# Patient Record
Sex: Male | Born: 1970
Health system: Southern US, Community
[De-identification: ages and names within clinical notes are randomized; demographics above are authoritative.]

## PROBLEM LIST (undated history)

## (undated) DIAGNOSIS — F17200 Nicotine dependence, unspecified, uncomplicated: Secondary | ICD-10-CM

## (undated) DIAGNOSIS — Z87442 Personal history of urinary calculi: Secondary | ICD-10-CM

## (undated) DIAGNOSIS — Z789 Other specified health status: Secondary | ICD-10-CM

## (undated) DIAGNOSIS — K649 Unspecified hemorrhoids: Secondary | ICD-10-CM

---

## 2008-03-26 ENCOUNTER — Emergency Department: Payer: Self-pay | Admitting: Unknown Physician Specialty

## 2008-03-28 ENCOUNTER — Emergency Department (HOSPITAL_COMMUNITY): Admission: EM | Admit: 2008-03-28 | Discharge: 2008-03-28 | Payer: Self-pay | Admitting: Emergency Medicine

## 2008-05-23 ENCOUNTER — Emergency Department: Payer: Self-pay | Admitting: Emergency Medicine

## 2008-06-10 ENCOUNTER — Emergency Department: Payer: Self-pay | Admitting: Emergency Medicine

## 2008-07-06 ENCOUNTER — Emergency Department (HOSPITAL_COMMUNITY): Admission: EM | Admit: 2008-07-06 | Discharge: 2008-07-06 | Payer: Self-pay | Admitting: Emergency Medicine

## 2008-07-07 ENCOUNTER — Emergency Department: Payer: Self-pay | Admitting: Emergency Medicine

## 2008-07-10 ENCOUNTER — Emergency Department (HOSPITAL_COMMUNITY): Admission: EM | Admit: 2008-07-10 | Discharge: 2008-07-10 | Payer: Self-pay | Admitting: Emergency Medicine

## 2008-07-13 ENCOUNTER — Emergency Department: Payer: Self-pay | Admitting: Emergency Medicine

## 2008-07-21 ENCOUNTER — Emergency Department (HOSPITAL_COMMUNITY): Admission: EM | Admit: 2008-07-21 | Discharge: 2008-07-21 | Payer: Self-pay | Admitting: Emergency Medicine

## 2008-08-15 ENCOUNTER — Emergency Department (HOSPITAL_COMMUNITY): Admission: EM | Admit: 2008-08-15 | Discharge: 2008-08-15 | Payer: Self-pay | Admitting: Emergency Medicine

## 2008-08-18 ENCOUNTER — Emergency Department: Payer: Self-pay | Admitting: Emergency Medicine

## 2009-03-22 ENCOUNTER — Emergency Department (HOSPITAL_COMMUNITY): Admission: EM | Admit: 2009-03-22 | Discharge: 2009-03-22 | Payer: Self-pay | Admitting: Emergency Medicine

## 2010-05-18 ENCOUNTER — Emergency Department: Payer: Self-pay | Admitting: Emergency Medicine

## 2011-10-30 ENCOUNTER — Emergency Department: Payer: Self-pay | Admitting: Emergency Medicine

## 2011-11-02 LAB — BETA STREP CULTURE(ARMC)

## 2013-06-07 ENCOUNTER — Emergency Department: Payer: Self-pay | Admitting: Emergency Medicine

## 2013-06-07 LAB — URINALYSIS, COMPLETE
Bacteria: NONE SEEN
Glucose,UR: NEGATIVE mg/dL (ref 0–75)
Ketone: NEGATIVE
Leukocyte Esterase: NEGATIVE
Protein: 30
RBC,UR: 121 /HPF (ref 0–5)
Specific Gravity: 1.03 (ref 1.003–1.030)
Squamous Epithelial: <1

## 2013-06-07 LAB — BASIC METABOLIC PANEL
Anion Gap: 7 (ref 7–16)
BUN: 16 mg/dL (ref 7–18)
Calcium, Total: 9.3 mg/dL (ref 8.5–10.1)
Osmolality: 280 (ref 275–301)
Potassium: 3.7 mmol/L (ref 3.5–5.1)
Sodium: 139 mmol/L (ref 136–145)

## 2013-06-07 LAB — CBC
MCHC: 34.2 g/dL (ref 32.0–36.0)
Platelet: 261 10*3/uL (ref 150–440)
RDW: 13.5 % (ref 11.5–14.5)

## 2013-06-24 ENCOUNTER — Emergency Department (HOSPITAL_COMMUNITY)
Admission: EM | Admit: 2013-06-24 | Discharge: 2013-06-24 | Disposition: A | Discharge status: Home / Self Care | Payer: Self-pay | Attending: Emergency Medicine | Admitting: Emergency Medicine

## 2013-06-24 ENCOUNTER — Encounter (HOSPITAL_COMMUNITY): Payer: Self-pay | Admitting: *Deleted

## 2013-06-24 ENCOUNTER — Emergency Department (HOSPITAL_COMMUNITY): Payer: Self-pay

## 2013-06-24 DIAGNOSIS — R05 Cough: Secondary | ICD-10-CM | POA: Insufficient documentation

## 2013-06-24 DIAGNOSIS — R059 Cough, unspecified: Secondary | ICD-10-CM | POA: Insufficient documentation

## 2013-06-24 DIAGNOSIS — F17200 Nicotine dependence, unspecified, uncomplicated: Secondary | ICD-10-CM | POA: Insufficient documentation

## 2013-06-24 NOTE — ED Notes (Signed)
Pt reports dry cough for over a month and reports body aches.

## 2013-06-24 NOTE — ED Notes (Signed)
Dr. Wickline at the bedside.  

## 2013-06-24 NOTE — ED Provider Notes (Signed)
CSN: [id number]     Arrival date & time 06/24/13  0935 History   First MD Initiated Contact with Patient 06/24/13 1009     Chief Complaint  Patient presents with  . Cough   Pt seen with medical student, I performed history/physical/documentation    Patient is a 42 y.o. male presenting with cough. The history is provided by the patient.  Cough Cough characteristics:  Dry Severity:  Moderate Onset quality:  Gradual Duration: 1 month. Timing:  Intermittent Progression:  Worsening Chronicity:  New Smoker: yes   Relieved by:  Nothing Associated symptoms: myalgias   Associated symptoms: no chest pain, no fever and no shortness of breath     PMH - none History reviewed. No pertinent past surgical history. No family history on file. History  Substance Use Topics  . Smoking status: Current Every Day Smoker  . Smokeless tobacco: Not on file  . Alcohol Use: Not on file     Comment: rare    Review of Systems  Constitutional: Negative for fever.  Respiratory: Positive for cough. Negative for shortness of breath.   Cardiovascular: Negative for chest pain.  Gastrointestinal: Negative for vomiting.  Musculoskeletal: Positive for myalgias.  All other systems reviewed and are negative.    Allergies  Review of patient's allergies indicates no known allergies.  Home Medications   Current Outpatient Rx  Name  Route  Sig  Dispense  Refill  . oxyCODONE-acetaminophen (PERCOCET/ROXICET) 5-325 MG per tablet   Oral   Take 1 tablet by mouth every 4 (four) hours as needed for pain.         . Tamsulosin HCl (FLOMAX PO)   Oral   Take 1 tablet by mouth daily as needed (to pass kidney stone).          BP 131/81  Pulse 63  Temp(Src) 98 F (36.7 C) (Oral)  Resp 18  SpO2 98% Physical Exam CONSTITUTIONAL: Well developed/well nourished HEAD: Normocephalic/atraumatic EYES: EOMI/PERRL ENMT: Mucous membranes moist, uvula midline, pharynx non-erythematous NECK: supple no meningeal  signs SPINE:entire spine nontender CV: S1/S2 noted, no murmurs/rubs/gallops noted LUNGS: Lungs are clear to auscultation bilaterally, no apparent distress ABDOMEN: soft, nontender, no rebound or guarding GU:no cva tenderness NEURO: Pt is awake/alert, moves all extremitiesx4 EXTREMITIES: pulses normal, full ROM, no LE edema SKIN: warm, color normal PSYCH: no abnormalities of mood noted  ED Course  Procedures  10:41 AM Pt with dry cough for one month.  Will get CXR.  He was informed to quit smoking He has not taken any meds or antibiotics as of yet  MDM  No diagnosis found. Nursing notes including past medical history and social history reviewed and considered in documentation xrays reviewed and considered     Joya Gaskins, MD 06/24/13 1124

## 2014-06-05 ENCOUNTER — Emergency Department: Payer: Self-pay | Admitting: Student

## 2014-06-14 DIAGNOSIS — K029 Dental caries, unspecified: Secondary | ICD-10-CM | POA: Insufficient documentation

## 2014-06-14 DIAGNOSIS — K089 Disorder of teeth and supporting structures, unspecified: Secondary | ICD-10-CM | POA: Insufficient documentation

## 2014-06-14 DIAGNOSIS — Z79899 Other long term (current) drug therapy: Secondary | ICD-10-CM | POA: Insufficient documentation

## 2014-06-14 DIAGNOSIS — F17200 Nicotine dependence, unspecified, uncomplicated: Secondary | ICD-10-CM | POA: Insufficient documentation

## 2014-06-15 ENCOUNTER — Encounter (HOSPITAL_COMMUNITY): Payer: Self-pay | Admitting: Emergency Medicine

## 2014-06-15 ENCOUNTER — Emergency Department (HOSPITAL_COMMUNITY)
Admission: EM | Admit: 2014-06-15 | Discharge: 2014-06-15 | Disposition: A | Discharge status: Home / Self Care | Payer: Self-pay | Attending: Emergency Medicine | Admitting: Emergency Medicine

## 2014-06-15 DIAGNOSIS — K029 Dental caries, unspecified: Secondary | ICD-10-CM

## 2014-06-15 MED ORDER — BUPIVACAINE-EPINEPHRINE (PF) 0.5% -1:200000 IJ SOLN
1.8000 mL | Freq: Once | INTRAMUSCULAR | Status: AC
  Administered 2014-06-15: 1.8 mL

## 2014-06-15 MED ORDER — PENICILLIN V POTASSIUM 500 MG PO TABS: 500.0000 mg | ORAL_TABLET | Freq: Three times a day (TID) | ORAL | Status: DC

## 2014-06-15 MED ORDER — HYDROCODONE-ACETAMINOPHEN 5-325 MG PO TABS: 1.0000 | ORAL_TABLET | ORAL | Status: DC | PRN

## 2014-06-15 NOTE — Discharge Instructions (Signed)
1. Medications: vicodin, penicillin, usual home medications 2. Treatment: rest, drink plenty of fluids, take medications as prescribed 3. Follow Up: Please followup with your primary doctor for discussion of your diagnoses and further evaluation after today's visit; if you do not have a primary care doctor use the resource guide provided to find one; f/u with dentistry as discussed    Dental Pain A tooth ache may be caused by cavities (tooth decay). Cavities expose the nerve of the tooth to air and hot or cold temperatures. It may come from an infection or abscess (also called a boil or furuncle) around your tooth. It is also often caused by dental caries (tooth decay). This causes the pain you are having. DIAGNOSIS  Your caregiver can diagnose this problem by exam. TREATMENT   If caused by an infection, it may be treated with medications which kill germs (antibiotics) and pain medications as prescribed by your caregiver. Take medications as directed.  Only take over-the-counter or prescription medicines for pain, discomfort, or fever as directed by your caregiver.  Whether the tooth ache today is caused by infection or dental disease, you should see your dentist as soon as possible for further care. SEEK MEDICAL CARE IF: The exam and treatment you received today has been provided on an emergency basis only. This is not a substitute for complete medical or dental care. If your problem worsens or new problems (symptoms) appear, and you are unable to meet with your dentist, call or return to this location. SEEK IMMEDIATE MEDICAL CARE IF:   You have a fever.  You develop redness and swelling of your face, jaw, or neck.  You are unable to open your mouth.  You have severe pain uncontrolled by pain medicine. MAKE SURE YOU:   Understand these instructions.  Will watch your condition.  Will get help right away if you are not doing well or get worse. Document Released: 09/26/2005 Document  Revised: 12/19/2011 Document Reviewed: 05/14/2008 [facility] Patient Information 2015 [place], [place]. This information is not intended to replace advice given to you by your health care provider. Make sure you discuss any questions you have with your health care provider.   Dental Caries Dental caries is tooth decay. This decay can cause a hole in teeth (cavity) that can get bigger and deeper over time. HOME CARE  Brush and floss your teeth. Do this at least two times a day.  Use a fluoride toothpaste.  Use a mouth rinse if told by your dentist or doctor.  Eat less sugary and starchy foods. Drink less sugary drinks.  Avoid snacking often on sugary and starchy foods. Avoid sipping often on sugary drinks.  Keep regular checkups and cleanings with your dentist.  Use fluoride supplements if told by your dentist or doctor.  Allow fluoride to be applied to teeth if told by your dentist or doctor. Document Released: 07/05/2008 Document Revised: 02/10/2014 Document Reviewed: 09/28/2012 [facility] Patient Information 2015 [place], [place]. This information is not intended to replace advice given to you by your health care provider. Make sure you discuss any questions you have with your health care provider.

## 2014-06-15 NOTE — ED Provider Notes (Signed)
Medical screening examination/treatment/procedure(s) were performed by non-physician practitioner and as supervising physician I was immediately available for consultation/collaboration.   EKG Interpretation None        Johnthomas Lader M Eileen Croswell, MD 06/15/14 0806 

## 2014-06-15 NOTE — ED Provider Notes (Signed)
CSN: [id number]     Arrival date & time 06/14/14  2344 History   First MD Initiated Contact with Patient 06/15/14 0104     Chief Complaint  Patient presents with  . Dental Pain     (Consider location/radiation/quality/duration/timing/severity/associated sxs/prior Treatment) Patient is a 43 y.o. male presenting with tooth pain. The history is provided by the patient and medical records. No language interpreter was used.  Dental Pain Associated symptoms: headaches   Associated symptoms: no drooling, no facial swelling, no fever and no neck pain    Randy Spencer is a 43 y.o. male  with no major medical Hx presents to the Emergency Department complaining of gradual, persistent, progressively worsening left lower dental pain onset 2 days ago.  Pt reports it has been a long time since he saw a dentist and he has had problems with this tooth for a while.  Pt is a smoker but denies dip.  Associated symptoms include headaches.  NO treatment PTA.  Nothing makes it better and eating, drinking makes it worse.  Pt denies fever, chills, neck pain, vomiting, rash.      History reviewed. No pertinent past medical history. History reviewed. No pertinent past surgical history. No family history on file. History  Substance Use Topics  . Smoking status: Current Every Day Smoker  . Smokeless tobacco: Not on file  . Alcohol Use: Yes     Comment: rare    Review of Systems  Constitutional: Negative for fever, chills and appetite change.  HENT: Positive for dental problem. Negative for drooling, ear pain, facial swelling, nosebleeds, postnasal drip, rhinorrhea and trouble swallowing.   Eyes: Negative for pain and redness.  Respiratory: Negative for cough and wheezing.   Cardiovascular: Negative for chest pain.  Gastrointestinal: Negative for nausea, vomiting and abdominal pain.  Musculoskeletal: Negative for neck pain and neck stiffness.  Skin: Negative for color change and rash.  Neurological:  Positive for headaches. Negative for weakness and light-headedness.  All other systems reviewed and are negative.     Allergies  Review of patient's allergies indicates no known allergies.  Home Medications   Prior to Admission medications   Medication Sig Start Date End Date Taking? Authorizing Provider  HYDROcodone-acetaminophen (NORCO/VICODIN) 5-325 MG per tablet Take 1-2 tablets by mouth every 4 (four) hours as needed for moderate pain or severe pain. 06/15/14   Maigen Mozingo, PA-C  oxyCODONE-acetaminophen (PERCOCET/ROXICET) 5-325 MG per tablet Take 1 tablet by mouth every 4 (four) hours as needed for pain.    Historical Provider, MD  penicillin v potassium (VEETID) 500 MG tablet Take 1 tablet (500 mg total) by mouth 3 (three) times daily. 06/15/14   Jaslyne Beeck, PA-C  Tamsulosin HCl (FLOMAX PO) Take 1 tablet by mouth daily as needed (to pass kidney stone).    Historical Provider, MD   BP 133/97  Pulse 73  Temp(Src) 98.7 F (37.1 C) (Oral)  Resp 20  Ht  (1.778 m)  Wt 190 lb (86.183 kg)  BMI 27.26 kg/m2  SpO2 99% Physical Exam  Nursing note and vitals reviewed. Constitutional: He appears well-developed and well-nourished.  HENT:  Head: Normocephalic.  Right Ear: Tympanic membrane, external ear and ear canal normal.  Left Ear: Tympanic membrane, external ear and ear canal normal.  Nose: Nose normal. Right sinus exhibits no maxillary sinus tenderness and no frontal sinus tenderness. Left sinus exhibits no maxillary sinus tenderness and no frontal sinus tenderness.  Mouth/Throat: Uvula is midline, oropharynx is clear and  moist and mucous membranes are normal. No oral lesions. Abnormal dentition. Dental caries present. No uvula swelling or lacerations. No oropharyngeal exudate, posterior oropharyngeal edema, posterior oropharyngeal erythema or tonsillar abscesses.  Decay of tooth #20 with pain to palpation Mild erythema of the surrounding tissue, no gross  abscess No induration or pain to palpation of the floor of the mouth  Eyes: Conjunctivae are normal. Pupils are equal, round, and reactive to light. Right eye exhibits no discharge. Left eye exhibits no discharge.  Neck: Normal range of motion. Neck supple.  Cardiovascular: Normal rate, regular rhythm, normal heart sounds and intact distal pulses.   No murmur heard. Pulmonary/Chest: Effort normal and breath sounds normal. No respiratory distress. He has no wheezes.  Abdominal: Soft. Bowel sounds are normal. He exhibits no distension. There is no tenderness.  Lymphadenopathy:    He has no cervical adenopathy.  Neurological: He is alert.  Skin: Skin is warm and dry.  Psychiatric: He has a normal mood and affect.    ED Course  Dental Date/Time: 06/15/2014 1:52 AM Performed by: Dierdre Forth Authorized by: Dierdre Forth Consent: Verbal consent obtained. Risks and benefits: risks, benefits and alternatives were discussed Consent given by: patient Patient understanding: patient states understanding of the procedure being performed Patient consent: the patient's understanding of the procedure matches consent given Procedure consent: procedure consent matches procedure scheduled Relevant documents: relevant documents present and verified Site marked: the operative site was marked Required items: required blood products, implants, devices, and special equipment available Patient identity confirmed: verbally with patient and arm band Time out: Immediately prior to procedure a "time out" was called to verify the correct patient, procedure, equipment, support staff and site/side marked as required. Preparation: Patient was prepped and draped in the usual sterile fashion. Local anesthesia used: yes Local anesthetic: bupivacaine 0.5% with epinephrine Anesthetic total: 1.3 ml Patient sedated: no Patient tolerance: Patient tolerated the procedure well with no immediate  complications. Comments: Dental block of Tooth #20 without complication and with complete pain resolution    (including critical care time) Labs Review Labs Reviewed - No data to display  Imaging Review No results found.   EKG Interpretation None      MDM   Final diagnoses:  Pain due to dental caries   Randy Spencer presents with 2 days of dental pain.  Patient with toothache.  No gross abscess.  Exam unconcerning for Ludwig's angina or spread of infection.  Will treat with penicillin and pain medicine.  Urged patient to follow-up with dentist.    BP 133/97  Pulse 73  Temp(Src) 98.7 F (37.1 C) (Oral)  Resp 20  Ht  (1.778 m)  Wt 190 lb (86.183 kg)  BMI 27.26 kg/m2  SpO2 99%   Randy Gieselman, PA-C 06/15/14 0200

## 2014-06-15 NOTE — ED Notes (Signed)
Pt discharged home with all belongings, pt alert, oriented, and ambulatory upon discharge. 2 new rx prescribed, pt drove self home, no narcotics given in ED

## 2014-06-15 NOTE — ED Notes (Signed)
Toothache for 2 days 

## 2014-06-15 NOTE — ED Notes (Signed)
Pt presents with Left lower tooth pain x2 days

## 2014-11-29 ENCOUNTER — Emergency Department: Payer: Self-pay | Admitting: Emergency Medicine

## 2015-01-06 ENCOUNTER — Ambulatory Visit: Payer: Self-pay | Admitting: Nurse Practitioner

## 2015-08-26 ENCOUNTER — Encounter: Payer: Self-pay | Admitting: Emergency Medicine

## 2015-08-26 ENCOUNTER — Emergency Department
Admission: EM | Admit: 2015-08-26 | Discharge: 2015-08-26 | Disposition: A | Discharge status: Home / Self Care | Payer: BLUE CROSS/BLUE SHIELD | Attending: Emergency Medicine | Admitting: Emergency Medicine

## 2015-08-26 DIAGNOSIS — K649 Unspecified hemorrhoids: Secondary | ICD-10-CM | POA: Diagnosis present

## 2015-08-26 DIAGNOSIS — K5900 Constipation, unspecified: Secondary | ICD-10-CM | POA: Insufficient documentation

## 2015-08-26 DIAGNOSIS — Z792 Long term (current) use of antibiotics: Secondary | ICD-10-CM | POA: Insufficient documentation

## 2015-08-26 DIAGNOSIS — F17210 Nicotine dependence, cigarettes, uncomplicated: Secondary | ICD-10-CM | POA: Insufficient documentation

## 2015-08-26 MED ORDER — POLYETHYLENE GLYCOL 3350 17 G PO PACK: 17.0000 g | PACK | Freq: Every day | ORAL | Status: DC

## 2015-08-26 NOTE — ED Notes (Signed)
Patient ambulatory to triage with steady gait, without difficulty or distress noted; pt reports hemorrhoidal pain since Saturday; using OTC cream with some relief; st had to call in to work and was told he needed to come get a doctor's note; st hx of same and has been treated here for such

## 2015-08-26 NOTE — Discharge Instructions (Signed)
Constipation, Adult °Constipation is when a person has fewer than three bowel movements a week, has difficulty having a bowel movement, or has stools that are dry, hard, or larger than normal. As people grow older, constipation is more common. A low-fiber diet, not taking in enough fluids, and taking certain medicines may make constipation worse.  °CAUSES  °· Certain medicines, such as antidepressants, pain medicine, iron supplements, antacids, and water pills.   °· Certain diseases, such as diabetes, irritable bowel syndrome (IBS), thyroid disease, or depression.   °· Not drinking enough water.   °· Not eating enough fiber-rich foods.   °· Stress or travel.   °· Lack of physical activity or exercise.   °· Ignoring the urge to have a bowel movement.   °· Using laxatives too much.   °SIGNS AND SYMPTOMS  °· Having fewer than three bowel movements a week.   °· Straining to have a bowel movement.   °· Having stools that are hard, dry, or larger than normal.   °· Feeling full or bloated.   °· Pain in the lower abdomen.   °· Not feeling relief after having a bowel movement.   °DIAGNOSIS  °Your health care provider will take a medical history and perform a physical exam. Further testing may be done for severe constipation. Some tests may include: °· A barium enema X-ray to examine your rectum, colon, and, sometimes, your small intestine.   °· A sigmoidoscopy to examine your lower colon.   °· A colonoscopy to examine your entire colon. °TREATMENT  °Treatment will depend on the severity of your constipation and what is causing it. Some dietary treatments include drinking more fluids and eating more fiber-rich foods. Lifestyle treatments may include regular exercise. If these diet and lifestyle recommendations do not help, your health care provider may recommend taking over-the-counter laxative medicines to help you have bowel movements. Prescription medicines may be prescribed if over-the-counter medicines do not work.    °HOME CARE INSTRUCTIONS  °· Eat foods that have a lot of fiber, such as fruits, vegetables, whole grains, and beans. °· Limit foods high in fat and processed sugars, such as french fries, hamburgers, cookies, candies, and soda.   °· A fiber supplement may be added to your diet if you cannot get enough fiber from foods.   °· Drink enough fluids to keep your urine clear or pale yellow.   °· Exercise regularly or as directed by your health care provider.   °· Go to the restroom when you have the urge to go. Do not hold it.   °· Only take over-the-counter or prescription medicines as directed by your health care provider. Do not take other medicines for constipation without talking to your health care provider first.   °SEEK IMMEDIATE MEDICAL CARE IF:  °· You have bright red blood in your stool.   °· Your constipation lasts for more than 4 days or gets worse.   °· You have abdominal or rectal pain.   °· You have thin, pencil-like stools.   °· You have unexplained weight loss. °MAKE SURE YOU:  °· Understand these instructions. °· Will watch your condition. °· Will get help right away if you are not doing well or get worse. °  °This information is not intended to replace advice given to you by your health care provider. Make sure you discuss any questions you have with your health care provider. °  °Document Released: 06/24/2004 Document Revised: 10/17/2014 Document Reviewed: 07/08/2013 °Elsevier Interactive Patient Education ©2016 [employer] ° °Hemorrhoids °Hemorrhoids are swollen veins around the rectum or anus. There are two types of hemorrhoids:  °· Internal   hemorrhoids. These occur in the veins just inside the rectum. They may poke through to the outside and become irritated and painful. °· External hemorrhoids. These occur in the veins outside the anus and can be felt as a painful swelling or hard lump near the anus. °CAUSES °· Pregnancy.   °· Obesity.   °· Constipation or diarrhea.   °· Straining to have a  bowel movement.   °· Sitting for long periods on the toilet. °· Heavy lifting or other activity that caused you to strain. °· Anal intercourse. °SYMPTOMS  °· Pain.   °· Anal itching or irritation.   °· Rectal bleeding.   °· Fecal leakage.   °· Anal swelling.   °· One or more lumps around the anus.   °DIAGNOSIS  °Your caregiver may be able to diagnose hemorrhoids by visual examination. Other examinations or tests that may be performed include:  °· Examination of the rectal area with a gloved hand (digital rectal exam).   °· Examination of anal canal using a small tube (scope).   °· A blood test if you have lost a significant amount of blood. °· A test to look inside the colon (sigmoidoscopy or colonoscopy). °TREATMENT °Most hemorrhoids can be treated at home. However, if symptoms do not seem to be getting better or if you have a lot of rectal bleeding, your caregiver may perform a procedure to help make the hemorrhoids get smaller or remove them completely. Possible treatments include:  °· Placing a rubber band at the base of the hemorrhoid to cut off the circulation (rubber band ligation).   °· Injecting a chemical to shrink the hemorrhoid (sclerotherapy).   °· Using a tool to burn the hemorrhoid (infrared light therapy).   °· Surgically removing the hemorrhoid (hemorrhoidectomy).   °· Stapling the hemorrhoid to block blood flow to the tissue (hemorrhoid stapling).   °HOME CARE INSTRUCTIONS  °· Eat foods with fiber, such as whole grains, beans, nuts, fruits, and vegetables. Ask your doctor about taking products with added fiber in them (fiber supplements). °· Increase fluid intake. Drink enough water and fluids to keep your urine clear or pale yellow.   °· Exercise regularly.   °· Go to the bathroom when you have the urge to have a bowel movement. Do not wait.   °· Avoid straining to have bowel movements.   °· Keep the anal area dry and clean. Use wet toilet paper or moist towelettes after a bowel movement.    °· Medicated creams and suppositories may be used or applied as directed.   °· Only take over-the-counter or prescription medicines as directed by your caregiver.   °· Take warm sitz baths for 15-20 minutes, 3-4 times a day to ease pain and discomfort.   °· Place ice packs on the hemorrhoids if they are tender and swollen. Using ice packs between sitz baths may be helpful.   °¨ Put ice in a plastic bag.   °¨ Place a towel between your skin and the bag.   °¨ Leave the ice on for 15-20 minutes, 3-4 times a day.   °· Do not use a donut-shaped pillow or sit on the toilet for long periods. This increases blood pooling and pain.   °SEEK MEDICAL CARE IF: °· You have increasing pain and swelling that is not controlled by treatment or medicine. °· You have uncontrolled bleeding. °· You have difficulty or you are unable to have a bowel movement. °· You have pain or inflammation outside the area of the hemorrhoids. °MAKE SURE YOU: °· Understand these instructions. °· Will watch your condition. °· Will get help right away if you are not doing well or   get worse. °  °This information is not intended to replace advice given to you by your health care provider. Make sure you discuss any questions you have with your health care provider. °  °Document Released: 09/23/2000 Document Revised: 09/12/2012 Document Reviewed: 07/31/2012 °Elsevier Interactive Patient Education ©2016 [employer] ° °

## 2015-08-26 NOTE — ED Provider Notes (Signed)
[facility] Emergency Department Provider Note  ____________________________________________  Time seen: Approximately 419 AM  I have reviewed the triage vital signs and the nursing notes.   HISTORY  Chief Complaint Hemorrhoids    HPI Yunior Jain is a 44 y.o. male who comes into the hospital today for a work note. The patient reports he missed work on Monday and Tuesday due to his hemorrhoids. He called work and he was told that he needed a doctor's note for missing those days so he came in for evaluation. He reports that currently his hemorrhoids are not hurting him as much as they had been previously and then not bleeding. He reports that he has been using a cream that has helped with his pain. The patient does not have any abdominal pain no cough no vomiting no dizziness or lightheadedness. He reports that he does have some constipation and has had problems with that in the past. The patient reports he is only here for a work note to go back to work.   History reviewed. No pertinent past medical history.  There are no active problems to display for this patient.   History reviewed. No pertinent past surgical history.  Current Outpatient Rx  Name  Route  Sig  Dispense  Refill  . HYDROcodone-acetaminophen (NORCO/VICODIN) 5-325 MG per tablet   Oral   Take 1-2 tablets by mouth every 4 (four) hours as needed for moderate pain or severe pain.   15 tablet   0   . oxyCODONE-acetaminophen (PERCOCET/ROXICET) 5-325 MG per tablet   Oral   Take 1 tablet by mouth every 4 (four) hours as needed for pain.         Marland Kitchen penicillin v potassium (VEETID) 500 MG tablet   Oral   Take 1 tablet (500 mg total) by mouth 3 (three) times daily.   30 tablet   0   . polyethylene glycol (MIRALAX) packet   Oral   Take 17 g by mouth daily.   14 each   0   . Tamsulosin HCl (FLOMAX PO)   Oral   Take 1 tablet by mouth daily as needed (to pass kidney stone).            Allergies Review of patient's allergies indicates no known allergies.  No family history on file.  Social History Social History  Substance Use Topics  . Smoking status: Current Every Day Smoker -- 1.00 packs/day    Types: Cigarettes  . Smokeless tobacco: None  . Alcohol Use: Yes     Comment: rare    Review of Systems Constitutional: No fever/chills Eyes: No visual changes. ENT: No sore throat. Cardiovascular: Denies chest pain. Respiratory: Denies shortness of breath. Gastrointestinal: Constipation and rectal pain  Genitourinary: Negative for dysuria. Musculoskeletal: Negative for back pain. Skin: Negative for rash. Neurological: Negative for headaches, focal weakness or numbness.  10-point ROS otherwise negative.  ____________________________________________   PHYSICAL EXAM:  VITAL SIGNS: ED Triage Vitals  Enc Vitals Group     BP 08/26/15 0036 152/88 mmHg     Pulse Rate 08/26/15 0036 104     Resp 08/26/15 0036 18     Temp 08/26/15 0036 98.2 F (36.8 C)     Temp Source 08/26/15 0036 Oral     SpO2 08/26/15 0036 97 %     Weight --      Height --      Head Cir --      Peak Flow --  Pain Score 08/26/15 0034 6     Pain Loc --      Pain Edu? --      Excl. in GC? --     Constitutional: Alert and oriented. Well appearing and in no acute distress. Eyes: Conjunctivae are normal. PERRL. EOMI. Head: Atraumatic. Nose: No congestion/rhinnorhea. Mouth/Throat: Mucous membranes are moist.  Oropharynx non-erythematous. Cardiovascular: Normal rate, regular rhythm. Grossly normal heart sounds.  Good peripheral circulation. Respiratory: Normal respiratory effort.  No retractions. Lungs CTAB. Gastrointestinal: Soft and nontender. No distention. Positive bowel sounds Rectal: Small hemorrhoids that are not swollen or tender to palpation in the rectal bleeding.  Musculoskeletal: No lower extremity tenderness nor edema.   Neurologic:  Normal speech and language.   Skin:  Skin is warm, dry and intact.  Psychiatric: Mood and affect are normal.  ____________________________________________   LABS (all labs ordered are listed, but only abnormal results are displayed)  Labs Reviewed - No data to display ____________________________________________  EKG  None ____________________________________________  RADIOLOGY  None ____________________________________________   PROCEDURES  Procedure(s) performed: None  Critical Care performed: No  ____________________________________________   INITIAL IMPRESSION / ASSESSMENT AND PLAN / ED COURSE  Pertinent labs & imaging results that were available during my care of the patient were reviewed by me and considered in my medical decision making (see chart for details).  This is a 44 year old male who comes in today asking for no. I informed the patient I could only give him a note for the database here and I can let him return to work. I did give the patient a prescription for MiraLAX to help with his constipation. The patient has no further complaints at this time he'll be discharged home. ____________________________________________   FINAL CLINICAL IMPRESSION(S) / ED DIAGNOSES  Final diagnoses:  Hemorrhoids, unspecified hemorrhoid type  Constipation, unspecified constipation type      Rebecka ApleyAllison P Webster, MD 08/26/15 [phone]

## 2015-12-09 ENCOUNTER — Emergency Department (HOSPITAL_COMMUNITY)
Admission: EM | Admit: 2015-12-09 | Discharge: 2015-12-09 | Disposition: A | Discharge status: Home / Self Care | Payer: BLUE CROSS/BLUE SHIELD | Attending: Physician Assistant | Admitting: Physician Assistant

## 2015-12-09 ENCOUNTER — Emergency Department (HOSPITAL_COMMUNITY): Payer: BLUE CROSS/BLUE SHIELD

## 2015-12-09 ENCOUNTER — Encounter (HOSPITAL_COMMUNITY): Payer: Self-pay | Admitting: Emergency Medicine

## 2015-12-09 DIAGNOSIS — F17210 Nicotine dependence, cigarettes, uncomplicated: Secondary | ICD-10-CM | POA: Diagnosis not present

## 2015-12-09 DIAGNOSIS — R5383 Other fatigue: Secondary | ICD-10-CM | POA: Diagnosis not present

## 2015-12-09 DIAGNOSIS — R509 Fever, unspecified: Secondary | ICD-10-CM | POA: Diagnosis present

## 2015-12-09 DIAGNOSIS — Z79899 Other long term (current) drug therapy: Secondary | ICD-10-CM | POA: Insufficient documentation

## 2015-12-09 DIAGNOSIS — R6889 Other general symptoms and signs: Secondary | ICD-10-CM

## 2015-12-09 DIAGNOSIS — R531 Weakness: Secondary | ICD-10-CM | POA: Diagnosis not present

## 2015-12-09 DIAGNOSIS — J069 Acute upper respiratory infection, unspecified: Secondary | ICD-10-CM | POA: Diagnosis not present

## 2015-12-09 LAB — CBC WITH DIFFERENTIAL/PLATELET
BASOS ABS: 0 10*3/uL (ref 0.0–0.1)
Basophils Relative: 0 %
EOS PCT: 0 %
Eosinophils Absolute: 0 10*3/uL (ref 0.0–0.7)
HEMATOCRIT: 45.1 % (ref 39.0–52.0)
HEMOGLOBIN: 15.7 g/dL (ref 13.0–17.0)
LYMPHS ABS: 0.6 10*3/uL — AB (ref 0.7–4.0)
LYMPHS PCT: 7 %
MCH: 31.1 pg (ref 26.0–34.0)
MCHC: 34.8 g/dL (ref 30.0–36.0)
MCV: 89.3 fL (ref 78.0–100.0)
Monocytes Absolute: 0.6 10*3/uL (ref 0.1–1.0)
Monocytes Relative: 6 %
NEUTROS ABS: 7.7 10*3/uL (ref 1.7–7.7)
NEUTROS PCT: 87 %
PLATELETS: 196 10*3/uL (ref 150–400)
RBC: 5.05 MIL/uL (ref 4.22–5.81)
RDW: 13.1 % (ref 11.5–15.5)
WBC: 8.9 10*3/uL (ref 4.0–10.5)

## 2015-12-09 LAB — BASIC METABOLIC PANEL
ANION GAP: 8 (ref 5–15)
BUN: 11 mg/dL (ref 6–20)
CHLORIDE: 108 mmol/L (ref 101–111)
CO2: 24 mmol/L (ref 22–32)
CREATININE: 1.33 mg/dL — AB (ref 0.61–1.24)
Calcium: 8.9 mg/dL (ref 8.9–10.3)
GFR calc Af Amer: >60 mL/min (ref >60)
GFR calc non Af Amer: >60 mL/min (ref >60)
Glucose, Bld: 102 mg/dL — ABNORMAL HIGH (ref 65–99)
Potassium: 3.7 mmol/L (ref 3.5–5.1)
Sodium: 140 mmol/L (ref 135–145)

## 2015-12-09 LAB — URINALYSIS, ROUTINE W REFLEX MICROSCOPIC
BILIRUBIN URINE: NEGATIVE
Glucose, UA: NEGATIVE mg/dL
Hgb urine dipstick: NEGATIVE
KETONES UR: NEGATIVE mg/dL
LEUKOCYTES UA: NEGATIVE
NITRITE: NEGATIVE
Protein, ur: NEGATIVE mg/dL
SPECIFIC GRAVITY, URINE: 1.022 (ref 1.005–1.030)
pH: 7 (ref 5.0–8.0)

## 2015-12-09 LAB — INFLUENZA PANEL BY PCR (TYPE A & B)
H1N1FLUPCR: NOT DETECTED
INFLAPCR: POSITIVE — AB
INFLBPCR: NEGATIVE

## 2015-12-09 MED ORDER — OSELTAMIVIR PHOSPHATE 75 MG PO CAPS: 75.0000 mg | ORAL_CAPSULE | Freq: Two times a day (BID) | ORAL | Status: DC

## 2015-12-09 MED ORDER — SODIUM CHLORIDE 0.9 % IV BOLUS (SEPSIS)
1000.0000 mL | Freq: Once | INTRAVENOUS | Status: AC
  Administered 2015-12-09: 1000 mL via INTRAVENOUS

## 2015-12-09 NOTE — ED Notes (Signed)
Patient transported to X-ray 

## 2015-12-09 NOTE — ED Notes (Signed)
Pt reports fever, cough and body aches that began yesterday evening. pts wife reports fevers up to 102.4. Pt took otc theraflu.

## 2015-12-09 NOTE — Discharge Instructions (Signed)
1. Medications: tamiflu, over the counter cough medicine, usual home medications 2. Treatment: rest, drink plenty of fluids 3. Follow Up: please followup with your primary doctor for discussion of your diagnoses and further evaluation after today's visit; if you do not have a primary care doctor use the resource guide provided to find one; please return to the ER for new or worsening symptoms    Upper Respiratory Infection, Adult Most upper respiratory infections (URIs) are a viral infection of the air passages leading to the lungs. A URI affects the nose, throat, and upper air passages. The most common type of URI is nasopharyngitis and is typically referred to as "the common cold." URIs run their course and usually go away on their own. Most of the time, a URI does not require medical attention, but sometimes a bacterial infection in the upper airways can follow a viral infection. This is called a secondary infection. Sinus and middle ear infections are common types of secondary upper respiratory infections. Bacterial pneumonia can also complicate a URI. A URI can worsen asthma and chronic obstructive pulmonary disease (COPD). Sometimes, these complications can require emergency medical care and may be life threatening.  CAUSES Almost all URIs are caused by viruses. A virus is a type of germ and can spread from one person to another.  RISKS FACTORS You may be at risk for a URI if:   You smoke.   You have chronic heart or lung disease.  You have a weakened defense (immune) system.   You are very young or very old.   You have nasal allergies or asthma.  You work in crowded or poorly ventilated areas.  You work in health care facilities or schools. SIGNS AND SYMPTOMS  Symptoms typically develop 2-3 days after you come in contact with a cold virus. Most viral URIs last 7-10 days. However, viral URIs from the influenza virus (flu virus) can last 14-18 days and are typically more severe.  Symptoms may include:   Runny or stuffy (congested) nose.   Sneezing.   Cough.   Sore throat.   Headache.   Fatigue.   Fever.   Loss of appetite.   Pain in your forehead, behind your eyes, and over your cheekbones (sinus pain).  Muscle aches.  DIAGNOSIS  Your health care provider may diagnose a URI by:  Physical exam.  Tests to check that your symptoms are not due to another condition such as:  Strep throat.  Sinusitis.  Pneumonia.  Asthma. TREATMENT  A URI goes away on its own with time. It cannot be cured with medicines, but medicines may be prescribed or recommended to relieve symptoms. Medicines may help:  Reduce your fever.  Reduce your cough.  Relieve nasal congestion. HOME CARE INSTRUCTIONS   Take medicines only as directed by your health care provider.   Gargle warm saltwater or take cough drops to comfort your throat as directed by your health care provider.  Use a warm mist humidifier or inhale steam from a shower to increase air moisture. This may make it easier to breathe.  Drink enough fluid to keep your urine clear or pale yellow.   Eat soups and other clear broths and maintain good nutrition.   Rest as needed.   Return to work when your temperature has returned to normal or as your health care provider advises. You may need to stay home longer to avoid infecting others. You can also use a face mask and careful hand washing to prevent  spread of the virus.  Increase the usage of your inhaler if you have asthma.   Do not use any tobacco products, including cigarettes, chewing tobacco, or electronic cigarettes. If you need help quitting, ask your health care provider. PREVENTION  The best way to protect yourself from getting a cold is to practice good hygiene.   Avoid oral or hand contact with people with cold symptoms.   Wash your hands often if contact occurs.  There is no clear evidence that vitamin C, vitamin E,  echinacea, or exercise reduces the chance of developing a cold. However, it is always recommended to get plenty of rest, exercise, and practice good nutrition.  SEEK MEDICAL CARE IF:   You are getting worse rather than better.   Your symptoms are not controlled by medicine.   You have chills.  You have worsening shortness of breath.  You have brown or red mucus.  You have yellow or brown nasal discharge.  You have pain in your face, especially when you bend forward.  You have a fever.  You have swollen neck glands.  You have pain while swallowing.  You have white areas in the back of your throat. SEEK IMMEDIATE MEDICAL CARE IF:   You have severe or persistent:  Headache.  Ear pain.  Sinus pain.  Chest pain.  You have chronic lung disease and any of the following:  Wheezing.  Prolonged cough.  Coughing up blood.  A change in your usual mucus.  You have a stiff neck.  You have changes in your:  Vision.  Hearing.  Thinking.  Mood. MAKE SURE YOU:   Understand these instructions.  Will watch your condition.  Will get help right away if you are not doing well or get worse.   This information is not intended to replace advice given to you by your health care provider. Make sure you discuss any questions you have with your health care provider.   Document Released: 03/22/2001 Document Revised: 02/10/2015 Document Reviewed: 01/01/2014 Elsevier Interactive Patient Education [employer]

## 2015-12-09 NOTE — ED Provider Notes (Signed)
CSN: [id number]     Arrival date & time 12/09/15  0729 History   First MD Initiated Contact with Patient 12/09/15 0732     Chief Complaint  Patient presents with  . Fever  . Cough    HPI   Randy Spencer is a 45 y.o. male with no pertinent PMH who presents to the ED with fever, cough productive of yellow sputum, and generalized body aches. He reports his symptoms started yesterday. He denies exacerbating factors. He has been taking over-the-counter cough and cold medications with no significant symptom relief. He reports recent sick contact, and states he has been around individuals diagnosed with flu. He reports he did not receive his flu vaccine this year. He denies abdominal pain, nausea, vomiting, diarrhea, constipation, dysuria, urgency, frequency.   History reviewed. No pertinent past medical history. History reviewed. No pertinent past surgical history. No family history on file. Social History  Substance Use Topics  . Smoking status: Current Every Day Smoker -- 1.00 packs/day    Types: Cigarettes  . Smokeless tobacco: None  . Alcohol Use: Yes     Comment: rare      Review of Systems  Constitutional: Positive for fever and fatigue. Negative for chills.  HENT: Positive for congestion.   Respiratory: Positive for cough.   Gastrointestinal: Negative for nausea, vomiting, abdominal pain, diarrhea and constipation.  Genitourinary: Negative for dysuria, urgency and frequency.  Neurological: Positive for weakness.  All other systems reviewed and are negative.     Allergies  Review of patient's allergies indicates no known allergies.  Home Medications   Prior to Admission medications   Medication Sig Start Date End Date Taking? Authorizing Provider  HYDROcodone-acetaminophen (NORCO/VICODIN) 5-325 MG per tablet Take 1-2 tablets by mouth every 4 (four) hours as needed for moderate pain or severe pain. 06/15/14   Hannah Muthersbaugh, PA-C  oseltamivir (TAMIFLU) 75 MG capsule  Take 1 capsule (75 mg total) by mouth every 12 (twelve) hours. 12/09/15   Mady Gemma, PA-C  oxyCODONE-acetaminophen (PERCOCET/ROXICET) 5-325 MG per tablet Take 1 tablet by mouth every 4 (four) hours as needed for pain.    Historical Provider, MD  penicillin v potassium (VEETID) 500 MG tablet Take 1 tablet (500 mg total) by mouth 3 (three) times daily. 06/15/14   Hannah Muthersbaugh, PA-C  polyethylene glycol (MIRALAX) packet Take 17 g by mouth daily. 08/26/15   Rebecka Apley, MD  Tamsulosin HCl (FLOMAX PO) Take 1 tablet by mouth daily as needed (to pass kidney stone).    Historical Provider, MD    BP 140/72 mmHg  Pulse 97  Temp(Src) 98.8 F (37.1 C) (Oral)  Resp 18  SpO2 100% Physical Exam  Constitutional: He is oriented to person, place, and time. He appears well-developed and well-nourished. No distress.  HENT:  Head: Normocephalic and atraumatic.  Right Ear: External ear normal.  Left Ear: External ear normal.  Nose: Nose normal.  Mouth/Throat: Uvula is midline, oropharynx is clear and moist and mucous membranes are normal. No oropharyngeal exudate, posterior oropharyngeal edema, posterior oropharyngeal erythema or tonsillar abscesses.  Eyes: Conjunctivae, EOM and lids are normal. Pupils are equal, round, and reactive to light. Right eye exhibits no discharge. Left eye exhibits no discharge. No scleral icterus.  Neck: Normal range of motion. Neck supple.  Cardiovascular: Normal rate, regular rhythm, normal heart sounds, intact distal pulses and normal pulses.   Pulmonary/Chest: Effort normal and breath sounds normal. No respiratory distress. He has no wheezes. He has  no rales.  Abdominal: Soft. Normal appearance and bowel sounds are normal. He exhibits no distension and no mass. There is no tenderness. There is no rigidity, no rebound and no guarding.  Musculoskeletal: Normal range of motion. He exhibits no edema or tenderness.  Neurological: He is alert and oriented to  person, place, and time. He has normal strength. No cranial nerve deficit or sensory deficit.  Skin: Skin is warm, dry and intact. No rash noted. He is not diaphoretic. No erythema. No pallor.  Psychiatric: He has a normal mood and affect. His speech is normal and behavior is normal.  Nursing note and vitals reviewed.   ED Course  Procedures (including critical care time)  Labs Review Labs Reviewed  CBC WITH DIFFERENTIAL/PLATELET - Abnormal; Notable for the following:    Lymphs Abs 0.6 (*)    All other components within normal limits  BASIC METABOLIC PANEL - Abnormal; Notable for the following:    Glucose, Bld 102 (*)    Creatinine, Ser 1.33 (*)    All other components within normal limits  INFLUENZA PANEL BY PCR (TYPE A & B, H1N1) - Abnormal; Notable for the following:    Influenza A By PCR POSITIVE (*)    All other components within normal limits  URINALYSIS, ROUTINE W REFLEX MICROSCOPIC (NOT AT [facility])    Imaging Review Dg Chest 2 View  12/09/2015  CLINICAL DATA:  Cough and fever for 1 day EXAM: CHEST  2 VIEW COMPARISON:  June 24, 2013 FINDINGS: There is no edema or consolidation. The heart size and pulmonary vascularity are normal. No adenopathy. No bone lesions. IMPRESSION: No edema or consolidation. Electronically Signed   By: Bretta Bang III M.D.   On: 12/09/2015 08:18   I have personally reviewed and evaluated these images and lab results as part of my medical decision-making.   EKG Interpretation None      MDM   Final diagnoses:  Flu-like symptoms  URI (upper respiratory infection)    45 year old male presents with fever, productive cough, and generalized body aches. Patient is afebrile. Vital signs stable. Posterior oropharynx without erythema, edema, or exudate. Heart regular rate and rhythm. Lungs clear to auscultation bilaterally. Abdomen soft, nontender, nondistended. Notes recent sick contact. CBC negative for leukocytosis or anemia. BMP remarkable for  creatinine 1.33. UA negative for infection. Chest x-ray no edema or consolidation. Patient given fluids. Symptoms likely viral. Patient is nontoxic and well-appearing, feel he is stable for discharge at this time. Given patient has had flu contact, notes flu-like symptoms, and is within the tamiflu window, will treat with tamiflu. Patient to follow up with PCP. Return precautions discussed. Patient verbalizes his understanding and is in agreement with plan.  BP 140/72 mmHg  Pulse 97  Temp(Src) 98.8 F (37.1 C) (Oral)  Resp 18  SpO2 100%    Mady Gemma, PA-C 12/09/15 1631  Courteney Randall An, MD 12/10/15 1604

## 2016-07-03 ENCOUNTER — Emergency Department (HOSPITAL_COMMUNITY)
Admission: EM | Admit: 2016-07-03 | Discharge: 2016-07-03 | Disposition: A | Discharge status: Home / Self Care | Payer: BLUE CROSS/BLUE SHIELD | Attending: Emergency Medicine | Admitting: Emergency Medicine

## 2016-07-03 ENCOUNTER — Encounter (HOSPITAL_COMMUNITY): Payer: Self-pay | Admitting: *Deleted

## 2016-07-03 DIAGNOSIS — K047 Periapical abscess without sinus: Secondary | ICD-10-CM | POA: Insufficient documentation

## 2016-07-03 DIAGNOSIS — K0889 Other specified disorders of teeth and supporting structures: Secondary | ICD-10-CM | POA: Diagnosis present

## 2016-07-03 DIAGNOSIS — F17210 Nicotine dependence, cigarettes, uncomplicated: Secondary | ICD-10-CM | POA: Diagnosis not present

## 2016-07-03 MED ORDER — PENICILLIN V POTASSIUM 250 MG PO TABS
500.0000 mg | ORAL_TABLET | Freq: Once | ORAL | Status: AC
  Administered 2016-07-03: 500 mg via ORAL
  Filled 2016-07-03: qty 2

## 2016-07-03 MED ORDER — OXYCODONE-ACETAMINOPHEN 5-325 MG PO TABS: 1.0000 | ORAL_TABLET | Freq: Four times a day (QID) | ORAL | 0 refills | Status: DC | PRN

## 2016-07-03 MED ORDER — PENICILLIN V POTASSIUM 500 MG PO TABS: 500.0000 mg | ORAL_TABLET | Freq: Four times a day (QID) | ORAL | 0 refills | Status: DC

## 2016-07-03 MED ORDER — HYDROCODONE-ACETAMINOPHEN 5-325 MG PO TABS
2.0000 | ORAL_TABLET | Freq: Once | ORAL | Status: AC
  Administered 2016-07-03: 2 via ORAL
  Filled 2016-07-03: qty 2

## 2016-07-03 NOTE — ED Notes (Signed)
Patient verbalized understanding of discharge instructions and denies any further needs or questions at this time. VS stable. Patient ambulatory with steady gait.  

## 2016-07-03 NOTE — ED Notes (Signed)
PA-C to see and assess patient before RN assessment. See PA note. 

## 2016-07-03 NOTE — ED Triage Notes (Signed)
The pt is c/o a toothache for 3-4 days swelliong present.  He has a dentist appointment  This Tuesday to have it extracted

## 2016-07-03 NOTE — ED Provider Notes (Signed)
[facility]-EMERGENCY DEPT Provider Note   CSN: [id number] Arrival date & time: 07/03/16  0030     History   Chief Complaint Chief Complaint  Patient presents with  . Dental Pain    HPI Randy Spencer is a 45 y.o. male.  Patient presents to the emergency department with a dental complaint. Symptoms began 3 days ago. The patient has tried to alleviate pain with ibuprofen.  Pain rated at a 10/10, characterized as throbbing in nature and located left lower rear molar. Patient denies fever, night sweats, chills, difficulty swallowing or opening mouth, SOB, nuchal rigidity or decreased ROM of neck.  Patient does not have a dentist and requests a resource guide at discharge.    The history is provided by the patient. No language interpreter was used.    History reviewed. No pertinent past medical history.  There are no active problems to display for this patient.   History reviewed. No pertinent surgical history.     Home Medications    Prior to Admission medications   Medication Sig Start Date End Date Taking? Authorizing Provider  oseltamivir (TAMIFLU) 75 MG capsule Take 1 capsule (75 mg total) by mouth every 12 (twelve) hours. 12/09/15   Mady Gemma, PA-C  oxyCODONE-acetaminophen (PERCOCET/ROXICET) 5-325 MG tablet Take 1 tablet by mouth every 6 (six) hours as needed for severe pain. 07/03/16   Roxy Horseman, PA-C  penicillin v potassium (VEETID) 500 MG tablet Take 1 tablet (500 mg total) by mouth 4 (four) times daily. 07/03/16   Roxy Horseman, PA-C  polyethylene glycol [facility]) packet Take 17 g by mouth daily. 08/26/15   Rebecka Apley, MD  Tamsulosin HCl (FLOMAX PO) Take 1 tablet by mouth daily as needed (to pass kidney stone).    Historical Provider, MD    Family History No family history on file.  Social History Social History  Substance Use Topics  . Smoking status: Current Every Day Smoker    Packs/day: 1.00    Types: Cigarettes  . Smokeless tobacco:  Never Used  . Alcohol use Yes     Comment: rare     Allergies   Review of patient's allergies indicates no known allergies.   Review of Systems Review of Systems  Constitutional: Negative for chills and fever.  HENT: Positive for dental problem. Negative for drooling.   Neurological: Negative for speech difficulty.  Psychiatric/Behavioral: Positive for sleep disturbance.     Physical Exam Updated Vital Signs BP 133/90   Pulse 71   Temp 98.2 F (36.8 C)   Resp 16   Ht 5\' 10"  (1.778 m)   Wt 90 kg   SpO2 98%   BMI 28.47 kg/m   Physical Exam Physical Exam  Constitutional: Pt appears well-developed and well-nourished.  HENT:  Head: Normocephalic.  Right Ear: Tympanic membrane, external ear and ear canal normal.  Left Ear: Tympanic membrane, external ear and ear canal normal.  Nose: Nose normal. Right sinus exhibits no maxillary sinus tenderness and no frontal sinus tenderness. Left sinus exhibits no maxillary sinus tenderness and no frontal sinus tenderness.  Mouth/Throat: Uvula is midline, oropharynx is clear and moist and mucous membranes are normal. No oral lesions. No uvula swelling or lacerations. No oropharyngeal exudate, posterior oropharyngeal edema, posterior oropharyngeal erythema or tonsillar abscesses.  Periapical abscess Poor dentition No sublingual edema, tenderness to palpation, or sign of Ludwig's angina, or deep space infection Pain at left lower rear molar Eyes: Conjunctivae are normal. Pupils are equal, round, and reactive  to light. Right eye exhibits no discharge. Left eye exhibits no discharge.  Neck: Normal range of motion. Neck supple.  No stridor Handling secretions without difficulty No nuchal rigidity No cervical lymphadenopathy Cardiovascular: Normal rate, regular rhythm and normal heart sounds.   Pulmonary/Chest: Effort normal. No respiratory distress.  Equal chest rise  Abdominal: Soft. Bowel sounds are normal. Pt exhibits no distension.  There is no tenderness.  Lymphadenopathy: Pt has no cervical adenopathy.  Neurological: Pt is alert and oriented x 4  Skin: Skin is warm and dry.  Psychiatric: Pt has a normal mood and affect.  Nursing note and vitals reviewed.    ED Treatments / Results  Labs (all labs ordered are listed, but only abnormal results are displayed) Labs Reviewed - No data to display  EKG  EKG Interpretation None       Radiology No results found.  Procedures Procedures (including critical care time)  Medications Ordered in ED Medications  penicillin v potassium (VEETID) tablet 500 mg (not administered)  HYDROcodone-acetaminophen (NORCO/VICODIN) 5-325 MG per tablet 2 tablet (not administered)     Initial Impression / Assessment and Plan / ED Course  I have reviewed the triage vital signs and the nursing notes.  Pertinent labs & imaging results that were available during my care of the patient were reviewed by me and considered in my medical decision making (see chart for details).  Clinical Course    INCISION AND DRAINAGE Performed by: Roxy HorsemanBROWNING, Zamaya Rapaport Consent: Verbal consent obtained. Risks and benefits: risks, benefits and alternatives were discussed Type: abscess  Body area: dental  Anesthesia: local infiltration  Incision was made with a scalpel.  Local anesthetic: Marcaine with epi  Anesthetic total: 1.8 ml  Complexity: complex Blunt dissection to break up loculations  Drainage: purulent  Drainage amount: moderate  Packing material: not packed  Patient tolerance: Patient tolerated the procedure well with no immediate complications.   Patient with dentalgia.  Exam not concerning for Ludwig's angina or pharyngeal abscess.  Will treat with penicillin. Pt instructed to follow-up with dentist.  Discussed return precautions. Pt safe for discharge.   Final Clinical Impressions(s) / ED Diagnoses   Final diagnoses:  Dental infection  Dental abscess    New  Prescriptions New Prescriptions   OXYCODONE-ACETAMINOPHEN (PERCOCET/ROXICET) 5-325 MG TABLET    Take 1 tablet by mouth every 6 (six) hours as needed for severe pain.   PENICILLIN V POTASSIUM (VEETID) 500 MG TABLET    Take 1 tablet (500 mg total) by mouth 4 (four) times daily.     Roxy Horsemanobert Lennin Osmond, PA-C 07/03/16 0126    Pricilla LovelessScott Goldston, MD 07/11/16 [id number]

## 2017-03-02 ENCOUNTER — Encounter: Payer: Self-pay | Admitting: Physician Assistant

## 2017-03-02 ENCOUNTER — Ambulatory Visit (INDEPENDENT_AMBULATORY_CARE_PROVIDER_SITE_OTHER): Payer: BLUE CROSS/BLUE SHIELD | Admitting: Physician Assistant

## 2017-03-02 VITALS — BP 136/83 | HR 85 | Temp 98.0°F | Resp 18 | Wt 197.4 lb

## 2017-03-02 DIAGNOSIS — K643 Fourth degree hemorrhoids: Secondary | ICD-10-CM | POA: Diagnosis not present

## 2017-03-02 DIAGNOSIS — K6289 Other specified diseases of anus and rectum: Secondary | ICD-10-CM | POA: Diagnosis not present

## 2017-03-02 DIAGNOSIS — K623 Rectal prolapse: Secondary | ICD-10-CM

## 2017-03-02 MED ORDER — HYDROCORTISONE ACETATE 25 MG RE SUPP: 25.0000 mg | Freq: Two times a day (BID) | RECTAL | 0 refills | Status: DC

## 2017-03-02 MED ORDER — HYDROCODONE-ACETAMINOPHEN 5-325 MG PO TABS: 1.0000 | ORAL_TABLET | Freq: Four times a day (QID) | ORAL | 0 refills | Status: DC | PRN

## 2017-03-02 NOTE — Progress Notes (Signed)
[facility] [address], [place] [address] [phone]  Date:  03/02/2017   Name:  Randy Spencer   DOB:  11-Jun-1971   MRN:  [id number]  PCP:  Patient, No Pcp Per    History of Present Illness:  Randy Spencer is a 46 y.o. male patient who presents to PCP with  Chief Complaint  Patient presents with  . Hemorrhoids    painful/bleeding x 2 days     3 days of watery diarrhea of 5-6 times per day, which has not improved.  It will have some difficulty with defecation but then it will explode out, and painful.  He has hemorrhoids.  No fever.    No abdominal pain.  He has a hx of hemorrhoids.  He has been sitting on the toilet for long periods in the attempt to defecate.  This would worsen her symptoms.   Lifts heavy objects in work.    There are no active problems to display for this patient.   No past medical history on file.  No past surgical history on file.  Social History  Substance Use Topics  . Smoking status: Current Every Day Smoker    Packs/day: 1.00    Types: Cigarettes  . Smokeless tobacco: Never Used  . Alcohol use Yes     Comment: rare    No family history on file.  No Known Allergies  Medication list has been reviewed and updated.  No current outpatient prescriptions on file prior to visit.   No current facility-administered medications on file prior to visit.     ROS ROS otherwise unremarkable unless listed above.  Physical Examination: BP 136/83 (Cuff Size: Normal)   Pulse 85   Temp 98 F (36.7 C) (Oral)   Resp 18   Wt 197 lb 6.4 oz (89.5 kg)   SpO2 99%   BMI 28.32 kg/m  Ideal Body Weight:    Physical Exam  Constitutional: He is oriented to person, place, and time. He appears well-developed and well-nourished. No distress.  HENT:  Head: Normocephalic and atraumatic.  Eyes: Conjunctivae and EOM are normal. Pupils are equal, round, and reactive to light.  Cardiovascular: Normal rate.   Pulmonary/Chest: Effort  normal. No respiratory distress.  Genitourinary: Rectal exam shows external hemorrhoid (severely edematous with visible clotting at the 6 o'clock.  ) and internal hemorrhoid.  Genitourinary Comments: Red mucosal tissue central to circumferential hemorrhoid.  Neurological: He is alert and oriented to person, place, and time.  Skin: Skin is warm and dry. He is not diaphoretic.  Psychiatric: He has a normal mood and affect. His behavior is normal.     Assessment and Plan: Randy Spencer is a 46 y.o. male who is here today for diarrhea and hemorrhoid Located a consult and potential procedure tomorrow at 2 pm with a surgical team..   20 minutes spent with direct and coordination of care for patient during visit. Advised alarming symptoms to warrant an immediate return. Grade IV hemorrhoids - Plan: hydrocortisone (ANUSOL-HC) 25 MG suppository, HYDROcodone-acetaminophen (NORCO) 5-325 MG tablet  Rectal pain - Plan: HYDROcodone-acetaminophen (NORCO) 5-325 MG tablet  Rectal prolapse - Plan: HYDROcodone-acetaminophen (NORCO) 5-325 MG tablet  Trena PlattStephanie Blasa Raisch, PA-C [facility] 5/26/201810:12 PM

## 2017-03-02 NOTE — Patient Instructions (Addendum)
Dr Daphine DeutscherMartin at [facility] [address] room 302 03/03/17 at 215pm    IF you received an x-ray today, you will receive an invoice from [facility]. Please contact [facility] at [phone] with questions or concerns regarding your invoice.   IF you received labwork today, you will receive an invoice from [facility]. Please contact [facility] at [phone] with questions or concerns regarding your invoice.   Our billing staff will not be able to assist you with questions regarding bills from these companies.  You will be contacted with the lab results as soon as they are available. The fastest way to get your results is to activate your My Chart account. Instructions are located on the last page of this paperwork. If you have not heard from us regarding the results in 2 weeks, please contact this office.

## 2017-04-03 ENCOUNTER — Encounter (HOSPITAL_BASED_OUTPATIENT_CLINIC_OR_DEPARTMENT_OTHER): Payer: Self-pay | Admitting: *Deleted

## 2017-04-06 ENCOUNTER — Ambulatory Visit: Payer: Self-pay | Admitting: Surgery

## 2017-04-06 NOTE — H&P (Signed)
Randy SnowballShannon L Spencer 03/10/2017 1:50 PM Location: [facility] Patient #: [id number] DOB: 1971/03/07 Single / Language: [place] / Race: White Male   History of Present Illness [name](Debria Broecker B. Daphine DeutscherMartin MD; 03/10/2017 2:13 PM) The patient is a 46 year old male who presents with hemorrhoids. After drainage several weeks ago, he still has prolapse of external and internal hemorrhoids. Continues to have bleeding from his rectum. 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 We discussed management strategies and he favors hemorrhoidectomy. Will plan EUA and hemorrhoidectomy as an outpatient.   Allergies Doristine Devoid(Chemira Jones, CMA; 03/10/2017 1:53 PM) No Known Drug Allergies 03/03/2017  Medication History (Doristine DevoidChemira Jones, CMA; 03/10/2017 1:54 PM) Hydrocodone-Acetaminophen (5-325MG  Tablet, 1 Tablet Oral every 4 to 6 hours as needed for pain, Taken starting 03/03/2017) Active. Lidocaine (5% Ointment, 1 application External three times daily, as needed, Taken starting 03/03/2017) Active. Norvasc (Oral) Specific strength unknown - Active. Medications Reconciled  Vitals (Chemira Jones CMA; 03/10/2017 1:53 PM) 03/10/2017 1:53 PM Weight: 186 lb Height: 60in Body Surface Area: 1.81 m Body Mass Index: 36.33 kg/m  Temp.: 98.54F(Oral)  Pulse: 89 (Regular)  BP: 130/78 (Sitting, Left Arm, Standard)       Physical Exam (Ashwika Freels B. Daphine DeutscherMartin MD; 03/10/2017 2:14 PM) General Note: HEENT unremarkable Neck supple Chest clear Heart SR without murmurs Abdomen nontender Rectal-severe prolapse with bleeding-much less painful than before   Rectal Note: prolapse worse on the right     Assessment & Plan [name](Bayli Quesinberry B. Daphine DeutscherMartin MD; 03/10/2017 2:15 PM) INTERNAL AND EXTERNAL PROLAPSED HEMORRHOIDS (K64.8) Impression: Schedule hemorrhoidectomy as outpatient  Matt B. Daphine DeutscherMartin, MD, FACS

## 2017-04-07 ENCOUNTER — Encounter (HOSPITAL_BASED_OUTPATIENT_CLINIC_OR_DEPARTMENT_OTHER)
Admission: RE | Disposition: A | Discharge status: Home / Self Care | Payer: Self-pay | Source: Ambulatory Visit | Attending: Surgery

## 2017-04-07 ENCOUNTER — Ambulatory Visit (HOSPITAL_BASED_OUTPATIENT_CLINIC_OR_DEPARTMENT_OTHER)
Admission: RE | Admit: 2017-04-07 | Discharge: 2017-04-07 | Disposition: A | Discharge status: Home / Self Care | Payer: BLUE CROSS/BLUE SHIELD | Source: Ambulatory Visit | Attending: Surgery | Admitting: Surgery

## 2017-04-07 ENCOUNTER — Ambulatory Visit (HOSPITAL_BASED_OUTPATIENT_CLINIC_OR_DEPARTMENT_OTHER): Payer: BLUE CROSS/BLUE SHIELD | Admitting: Anesthesiology

## 2017-04-07 ENCOUNTER — Encounter (HOSPITAL_BASED_OUTPATIENT_CLINIC_OR_DEPARTMENT_OTHER): Payer: Self-pay | Admitting: Anesthesiology

## 2017-04-07 DIAGNOSIS — K648 Other hemorrhoids: Secondary | ICD-10-CM | POA: Insufficient documentation

## 2017-04-07 DIAGNOSIS — K623 Rectal prolapse: Secondary | ICD-10-CM

## 2017-04-07 DIAGNOSIS — K643 Fourth degree hemorrhoids: Secondary | ICD-10-CM

## 2017-04-07 DIAGNOSIS — K6289 Other specified diseases of anus and rectum: Secondary | ICD-10-CM

## 2017-04-07 HISTORY — PX: HEMORRHOID SURGERY: SHX153

## 2017-04-07 HISTORY — DX: Nicotine dependence, unspecified, uncomplicated: F17.200

## 2017-04-07 HISTORY — DX: Other specified health status: Z78.9

## 2017-04-07 HISTORY — DX: Unspecified hemorrhoids: K64.9

## 2017-04-07 SURGERY — HEMORRHOIDECTOMY
Anesthesia: General | Site: Anus

## 2017-04-07 MED ORDER — CEFAZOLIN SODIUM-DEXTROSE 2-4 GM/100ML-% IV SOLN
INTRAVENOUS | Status: AC
  Filled 2017-04-07: qty 100

## 2017-04-07 MED ORDER — CHLORHEXIDINE GLUCONATE CLOTH 2 % EX PADS: 6.0000 | MEDICATED_PAD | Freq: Once | CUTANEOUS | Status: DC

## 2017-04-07 MED ORDER — BUPIVACAINE LIPOSOME 1.3 % IJ SUSP
INTRAMUSCULAR | Status: AC
  Filled 2017-04-07: qty 20

## 2017-04-07 MED ORDER — LIDOCAINE 2% (20 MG/ML) 5 ML SYRINGE
INTRAMUSCULAR | Status: DC | PRN
  Administered 2017-04-07: 80 mg via INTRAVENOUS

## 2017-04-07 MED ORDER — FENTANYL CITRATE (PF) 100 MCG/2ML IJ SOLN: 25.0000 ug | INTRAMUSCULAR | Status: DC | PRN

## 2017-04-07 MED ORDER — SCOPOLAMINE 1 MG/3DAYS TD PT72: 1.0000 | MEDICATED_PATCH | Freq: Once | TRANSDERMAL | Status: DC | PRN

## 2017-04-07 MED ORDER — ONDANSETRON HCL 4 MG/2ML IJ SOLN
INTRAMUSCULAR | Status: AC
  Filled 2017-04-07: qty 2

## 2017-04-07 MED ORDER — ONDANSETRON HCL 4 MG/2ML IJ SOLN
INTRAMUSCULAR | Status: DC | PRN
  Administered 2017-04-07: 4 mg via INTRAVENOUS

## 2017-04-07 MED ORDER — BUPIVACAINE-EPINEPHRINE (PF) 0.5% -1:200000 IJ SOLN
INTRAMUSCULAR | Status: AC
  Filled 2017-04-07: qty 30

## 2017-04-07 MED ORDER — MEPERIDINE HCL 25 MG/ML IJ SOLN: 6.2500 mg | INTRAMUSCULAR | Status: DC | PRN

## 2017-04-07 MED ORDER — OXYCODONE HCL 5 MG PO TABS
ORAL_TABLET | ORAL | Status: AC
  Filled 2017-04-07: qty 1

## 2017-04-07 MED ORDER — POVIDONE-IODINE 10 % EX OINT
TOPICAL_OINTMENT | CUTANEOUS | Status: DC | PRN
  Administered 2017-04-07: 1 via TOPICAL

## 2017-04-07 MED ORDER — PROPOFOL 10 MG/ML IV BOLUS
INTRAVENOUS | Status: DC | PRN
  Administered 2017-04-07: 200 mg via INTRAVENOUS
  Administered 2017-04-07: 20 mg via INTRAVENOUS

## 2017-04-07 MED ORDER — MIDAZOLAM HCL 2 MG/2ML IJ SOLN
INTRAMUSCULAR | Status: AC
  Filled 2017-04-07: qty 2

## 2017-04-07 MED ORDER — CEFAZOLIN SODIUM-DEXTROSE 2-4 GM/100ML-% IV SOLN
2.0000 g | INTRAVENOUS | Status: AC
  Administered 2017-04-07: 2 g via INTRAVENOUS

## 2017-04-07 MED ORDER — PROMETHAZINE HCL 25 MG/ML IJ SOLN: 6.2500 mg | INTRAMUSCULAR | Status: DC | PRN

## 2017-04-07 MED ORDER — BUPIVACAINE LIPOSOME 1.3 % IJ SUSP
INTRAMUSCULAR | Status: DC | PRN
  Administered 2017-04-07: 20 mL

## 2017-04-07 MED ORDER — OXYCODONE HCL 5 MG PO TABS
5.0000 mg | ORAL_TABLET | Freq: Once | ORAL | Status: AC | PRN
  Administered 2017-04-07: 5 mg via ORAL

## 2017-04-07 MED ORDER — OXYCODONE HCL 5 MG/5ML PO SOLN: 5.0000 mg | Freq: Once | ORAL | Status: AC | PRN

## 2017-04-07 MED ORDER — DEXAMETHASONE SODIUM PHOSPHATE 10 MG/ML IJ SOLN
INTRAMUSCULAR | Status: AC
  Filled 2017-04-07: qty 1

## 2017-04-07 MED ORDER — FENTANYL CITRATE (PF) 100 MCG/2ML IJ SOLN
50.0000 ug | INTRAMUSCULAR | Status: DC | PRN
  Administered 2017-04-07: 100 ug via INTRAVENOUS

## 2017-04-07 MED ORDER — FENTANYL CITRATE (PF) 100 MCG/2ML IJ SOLN
INTRAMUSCULAR | Status: AC
  Filled 2017-04-07: qty 2

## 2017-04-07 MED ORDER — LACTATED RINGERS IV SOLN
INTRAVENOUS | Status: DC
  Administered 2017-04-07: 12:00:00 via INTRAVENOUS

## 2017-04-07 MED ORDER — DEXAMETHASONE SODIUM PHOSPHATE 4 MG/ML IJ SOLN
INTRAMUSCULAR | Status: DC | PRN
  Administered 2017-04-07: 10 mg via INTRAVENOUS

## 2017-04-07 MED ORDER — POVIDONE-IODINE 10 % EX OINT
TOPICAL_OINTMENT | CUTANEOUS | Status: AC
  Filled 2017-04-07: qty 28.35

## 2017-04-07 MED ORDER — HYDROCODONE-ACETAMINOPHEN 5-325 MG PO TABS: 1.0000 | ORAL_TABLET | Freq: Four times a day (QID) | ORAL | 0 refills | Status: DC | PRN

## 2017-04-07 MED ORDER — LACTATED RINGERS IV SOLN: INTRAVENOUS | Status: DC

## 2017-04-07 MED ORDER — MIDAZOLAM HCL 2 MG/2ML IJ SOLN
1.0000 mg | INTRAMUSCULAR | Status: DC | PRN
  Administered 2017-04-07: 2 mg via INTRAVENOUS

## 2017-04-07 SURGICAL SUPPLY — 35 items
BLADE SURG 15 STRL LF DISP TIS (BLADE) ×1 IMPLANT
BLADE SURG 15 STRL SS (BLADE) ×1
BRIEF STRETCH FOR OB PAD LRG (UNDERPADS AND DIAPERS) ×2 IMPLANT
CANISTER SUCT 1200ML W/VALVE (MISCELLANEOUS) ×2 IMPLANT
COVER MAYO STAND STRL (DRAPES) ×2 IMPLANT
DECANTER SPIKE VIAL GLASS SM (MISCELLANEOUS) ×2 IMPLANT
DRSG PAD ABDOMINAL 8X10 ST (GAUZE/BANDAGES/DRESSINGS) IMPLANT
ELECT REM PT RETURN 9FT ADLT (ELECTROSURGICAL) ×2
ELECTRODE REM PT RTRN 9FT ADLT (ELECTROSURGICAL) ×1 IMPLANT
GAUZE PETROLATUM 1 X8 (GAUZE/BANDAGES/DRESSINGS) IMPLANT
GAUZE SPONGE 4X4 12PLY STRL LF (GAUZE/BANDAGES/DRESSINGS) ×2 IMPLANT
GLOVE BIO SURGEON STRL SZ8 (GLOVE) ×2 IMPLANT
GLOVE BIOGEL PI IND STRL 7.0 (GLOVE) ×1 IMPLANT
GLOVE BIOGEL PI INDICATOR 7.0 (GLOVE) ×1
GLOVE ECLIPSE 6.5 STRL STRAW (GLOVE) ×2 IMPLANT
GOWN STRL REUS W/ TWL LRG LVL3 (GOWN DISPOSABLE) ×1 IMPLANT
GOWN STRL REUS W/ TWL XL LVL3 (GOWN DISPOSABLE) ×1 IMPLANT
GOWN STRL REUS W/TWL LRG LVL3 (GOWN DISPOSABLE) ×1
GOWN STRL REUS W/TWL XL LVL3 (GOWN DISPOSABLE) ×1
NEEDLE HYPO 25X1 1.5 SAFETY (NEEDLE) ×2 IMPLANT
PACK BASIN DAY SURGERY FS (CUSTOM PROCEDURE TRAY) ×2 IMPLANT
PACK LITHOTOMY IV (CUSTOM PROCEDURE TRAY) ×2 IMPLANT
PENCIL BUTTON HOLSTER BLD 10FT (ELECTRODE) ×2 IMPLANT
SHEET MEDIUM DRAPE 40X70 STRL (DRAPES) ×2 IMPLANT
SLEEVE SCD COMPRESS KNEE MED (MISCELLANEOUS) ×2 IMPLANT
SURGILUBE 2OZ TUBE FLIPTOP (MISCELLANEOUS) ×2 IMPLANT
SUT CHROMIC 2 0 SH (SUTURE) IMPLANT
SUT CHROMIC 3 0 SH 27 (SUTURE) ×4 IMPLANT
SYR CONTROL 10ML LL (SYRINGE) ×2 IMPLANT
TOWEL OR 17X24 6PK STRL BLUE (TOWEL DISPOSABLE) ×4 IMPLANT
TRAY DSU PREP LF (CUSTOM PROCEDURE TRAY) ×2 IMPLANT
TRAY PROCTOSCOPIC FIBER OPTIC (SET/KITS/TRAYS/PACK) IMPLANT
TUBE CONNECTING 20X1/4 (TUBING) ×2 IMPLANT
UNDERPAD 30X30 (UNDERPADS AND DIAPERS) ×2 IMPLANT
YANKAUER SUCT BULB TIP NO VENT (SUCTIONS) ×2 IMPLANT

## 2017-04-07 NOTE — Op Note (Signed)
Surgeon: Wenda LowMatt Rumaldo Difatta, MD, FACS  Asst:  none  Anes:  General in dorsal lithotomy  Preop Dx: Prolapsed hemorrhoids Postop Dx: same  Procedure: hemorrhoidectomy Location Surgery: CDS 1 Complications: none  EBL:   5 cc  Drains: none  Description of Procedure:  The patient was taken to OR 1 .  After anesthesia was administered and the patient was prepped a timeout was performed.  Prepped with Technicare.  EUA performed and at the 4-6 position there was the remnant of the prolapsed hemorroid with the large outside hemorrhoid and the inner band of strawberry like internal hemorrhoid.  This was grasped with hemorrhoidal clamp and excised with the harmonic scalpel.  The mucosa was reapproximated with interrupted 3-0 chromic.    At the 8-9 position there was another internal column that was not as prominent.  With was clamped and excised similarly. Hemorrhoage was minimal and was controlled.    Betadine ointment was massaged into the anal canal and then the sphincters were infiltrated with a total of 20 cc of Exparel.    The patient tolerated the procedure well and was taken to the PACU in stable condition.     Matt B. Daphine DeutscherMartin, MD, [facility], [place] [phone]

## 2017-04-07 NOTE — Transfer of Care (Signed)
Immediate Anesthesia Transfer of Care Note  Patient: Randy Spencer  Procedure(s) Performed: Procedure(s): HEMORRHOIDECTOMY (N/A)  Patient Location: PACU  Anesthesia Type:General  Level of Consciousness: sedated and responds to stimulation  Airway & Oxygen Therapy: Patient Spontanous Breathing and Patient connected to face mask oxygen  Post-op Assessment: Report given to RN and Post -op Vital signs reviewed and stable  Post vital signs: Reviewed and stable  Last Vitals:  Vitals:   04/07/17 1140  BP: 128/85  Pulse: 70  Temp: 36.8 C    Last Pain:  Vitals:   04/07/17 1140  TempSrc: Oral      Patients Stated Pain Goal: 3 (04/07/17 1140)  Complications: No apparent anesthesia complications

## 2017-04-07 NOTE — Anesthesia Procedure Notes (Signed)
Procedure Name: LMA Insertion Date/Time: 04/07/2017 12:09 PM Performed by: Gar GibbonKEETON, Jimesha Rising S Pre-anesthesia Checklist: Patient identified, Emergency Drugs available, Suction available and Patient being monitored Patient Re-evaluated:Patient Re-evaluated prior to inductionOxygen Delivery Method: Circle system utilized Preoxygenation: Pre-oxygenation with 100% oxygen Intubation Type: IV induction Ventilation: Mask ventilation without difficulty LMA: LMA inserted LMA Size: 4.0 Number of attempts: 1 Airway Equipment and Method: Bite block Placement Confirmation: positive ETCO2 Tube secured with: Tape Dental Injury: Teeth and Oropharynx as per pre-operative assessment

## 2017-04-07 NOTE — Anesthesia Postprocedure Evaluation (Signed)
Anesthesia Post Note  Patient: [name]  Procedure(s) Performed: Procedure(s) (LRB): HEMORRHOIDECTOMY (N/A)     Patient location during evaluation: PACU Anesthesia Type: General Level of consciousness: awake and alert Pain management: pain level controlled Vital Signs Assessment: post-procedure vital signs reviewed and stable Respiratory status: spontaneous breathing, nonlabored ventilation, respiratory function stable and patient connected to nasal cannula oxygen Cardiovascular status: blood pressure returned to baseline and stable Postop Assessment: no signs of nausea or vomiting Anesthetic complications: no    Last Vitals:  Vitals:   04/07/17 1315 04/07/17 1340  BP: 108/90 123/82  Pulse: 78 83  Resp: (!) 23 18  Temp:  36.5 C    Last Pain:  Vitals:   04/07/17 1340  TempSrc: Oral  PainSc: 3                  Shelton SilvasKevin D Akif Weldy

## 2017-04-07 NOTE — Discharge Instructions (Signed)
Surgical Procedures for Hemorrhoids, Care After Refer to this sheet in the next few weeks. These instructions provide you with information about caring for yourself after your procedure. Your health care provider may also give you more specific instructions. Your treatment has been planned according to current medical practices, but problems sometimes occur. Call your health care provider if you have any problems or questions after your procedure. What can I expect after the procedure? After the procedure, it is common to have:  Rectal pain.  Pain when you are having a bowel movement.  Slight rectal bleeding.  Follow these instructions at home: Medicines  Take over-the-counter and prescription medicines only as told by your health care provider.  Do not drive or operate heavy machinery while taking prescription pain medicine.  Use a stool softener or a bulk laxative as told by your health care provider. Activity  Rest at home. Return to your normal activities as told by your health care provider.  Do not lift anything that is heavier than 10 lb (4.5 kg).  Do not sit for long periods of time. Take a walk every day or as told by your health care provider.  Do not strain to have a bowel movement. Do not spend a long time sitting on the toilet. Eating and drinking  Eat foods that contain fiber, such as whole grains, beans, nuts, fruits, and vegetables.  Drink enough fluid to keep your urine clear or pale yellow. General instructions  Sit in a warm bath 2-3 times per day to relieve soreness or itching.  Keep all follow-up visits as told by your health care provider. This is important. Contact a health care provider if:  Your pain medicine is not helping.  You have a fever or chills.  You become constipated.  You have trouble passing urine. Get help right away if:  You have very bad rectal pain.  You have heavy bleeding from your rectum. This information is not intended  to replace advice given to you by your health care provider. Make sure you discuss any questions you have with your health care provider. Document Released: 12/17/2003 Document Revised: 03/03/2016 Document Reviewed: 12/22/2014 Elsevier Interactive Patient Education  2018 [employer]      Post Anesthesia Home Care Instructions  Activity: Get plenty of rest for the remainder of the day. A responsible individual must stay with you for 24 hours following the procedure.  For the next 24 hours, DO NOT: -Drive a car -Advertising copywriterperate machinery -Drink alcoholic beverages -Take any medication unless instructed by your physician -Make any legal decisions or sign important papers.  Meals: Start with liquid foods such as gelatin or soup. Progress to regular foods as tolerated. Avoid greasy, spicy, heavy foods. If nausea and/or vomiting occur, drink only clear liquids until the nausea and/or vomiting subsides. Call your physician if vomiting continues.  Special Instructions/Symptoms: Your throat may feel dry or sore from the anesthesia or the breathing tube placed in your throat during surgery. If this causes discomfort, gargle with warm salt water. The discomfort should disappear within 24 hours.  If you had a scopolamine patch placed behind your ear for the management of post- operative nausea and/or vomiting:  1. The medication in the patch is effective for 72 hours, after which it should be removed.  Wrap patch in a tissue and discard in the trash. Wash hands thoroughly with soap and water. 2. You may remove the patch earlier than 72 hours if you experience unpleasant side effects  which may include dry mouth, dizziness or visual disturbances. 3. Avoid touching the patch. Wash your hands with soap and water after contact with the patch.

## 2017-04-07 NOTE — Interval H&P Note (Signed)
History and Physical Interval Note:  04/07/2017 11:50 AM  Randy Spencer  has presented today for surgery, with the diagnosis of prolapsed hemorrhoids  The various methods of treatment have been discussed with the patient and family. After consideration of risks, benefits and other options for treatment, the patient has consented to  Procedure(s): HEMORRHOIDECTOMY (N/A) as a surgical intervention .  The patient's history has been reviewed, patient examined, no change in status, stable for surgery.  I have reviewed the patient's chart and labs.  Questions were answered to the patient's satisfaction.     Elner Seifert B

## 2017-04-07 NOTE — Anesthesia Preprocedure Evaluation (Addendum)
Anesthesia Evaluation  Patient identified by MRN, date of birth, ID band Patient awake    Reviewed: Allergy & Precautions, Patient's Chart, lab work & pertinent test results  Airway Mallampati: II  TM Distance: >3 FB Neck ROM: Full    Dental  (+) Teeth Intact, Dental Advisory Given   Pulmonary Current Smoker,    breath sounds clear to auscultation       Cardiovascular negative cardio ROS   Rhythm:Regular Rate:Normal     Neuro/Psych negative neurological ROS  negative psych ROS   GI/Hepatic negative GI ROS, Neg liver ROS,   Endo/Other  negative endocrine ROS  Renal/GU negative Renal ROS  negative genitourinary   Musculoskeletal negative musculoskeletal ROS (+)   Abdominal Normal abdominal exam  (+)   Peds negative pediatric ROS (+)  Hematology negative hematology ROS (+)   Anesthesia Other Findings   Reproductive/Obstetrics negative OB ROS                            Anesthesia Physical Anesthesia Plan  ASA: II  Anesthesia Plan: General   Post-op Pain Management:    Induction: Intravenous  PONV Risk Score and Plan: 2 and Ondansetron and Dexamethasone  Airway Management Planned: LMA  Additional Equipment:   Intra-op Plan:   Post-operative Plan: Extubation in OR  Informed Consent: I have reviewed the patients History and Physical, chart, labs and discussed the procedure including the risks, benefits and alternatives for the proposed anesthesia with the patient or authorized representative who has indicated his/her understanding and acceptance.   Dental advisory given  Plan Discussed with:   Anesthesia Plan Comments:         Anesthesia Quick Evaluation

## 2017-04-10 ENCOUNTER — Encounter (HOSPITAL_BASED_OUTPATIENT_CLINIC_OR_DEPARTMENT_OTHER): Payer: Self-pay | Admitting: Surgery

## 2017-05-12 ENCOUNTER — Ambulatory Visit: Payer: BLUE CROSS/BLUE SHIELD | Admitting: Family Medicine

## 2017-05-12 DIAGNOSIS — Z0289 Encounter for other administrative examinations: Secondary | ICD-10-CM

## 2017-05-18 ENCOUNTER — Encounter: Payer: BLUE CROSS/BLUE SHIELD | Admitting: Medical

## 2018-01-10 ENCOUNTER — Encounter: Payer: Self-pay | Admitting: Physician Assistant

## 2018-02-03 ENCOUNTER — Emergency Department (HOSPITAL_COMMUNITY): Payer: 59

## 2018-02-03 ENCOUNTER — Encounter (HOSPITAL_COMMUNITY): Payer: Self-pay

## 2018-02-03 ENCOUNTER — Other Ambulatory Visit: Payer: Self-pay

## 2018-02-03 ENCOUNTER — Emergency Department (HOSPITAL_COMMUNITY)
Admission: EM | Admit: 2018-02-03 | Discharge: 2018-02-03 | Disposition: A | Discharge status: Home / Self Care | Payer: 59 | Attending: Emergency Medicine | Admitting: Emergency Medicine

## 2018-02-03 DIAGNOSIS — M542 Cervicalgia: Secondary | ICD-10-CM | POA: Diagnosis not present

## 2018-02-03 DIAGNOSIS — F17210 Nicotine dependence, cigarettes, uncomplicated: Secondary | ICD-10-CM | POA: Insufficient documentation

## 2018-02-03 DIAGNOSIS — Z79899 Other long term (current) drug therapy: Secondary | ICD-10-CM | POA: Diagnosis not present

## 2018-02-03 DIAGNOSIS — Y939 Activity, unspecified: Secondary | ICD-10-CM | POA: Diagnosis not present

## 2018-02-03 DIAGNOSIS — Y92410 Unspecified street and highway as the place of occurrence of the external cause: Secondary | ICD-10-CM | POA: Insufficient documentation

## 2018-02-03 DIAGNOSIS — S161XXA Strain of muscle, fascia and tendon at neck level, initial encounter: Secondary | ICD-10-CM | POA: Insufficient documentation

## 2018-02-03 DIAGNOSIS — S199XXA Unspecified injury of neck, initial encounter: Secondary | ICD-10-CM | POA: Diagnosis present

## 2018-02-03 DIAGNOSIS — S86912A Strain of unspecified muscle(s) and tendon(s) at lower leg level, left leg, initial encounter: Secondary | ICD-10-CM | POA: Diagnosis not present

## 2018-02-03 DIAGNOSIS — Y999 Unspecified external cause status: Secondary | ICD-10-CM | POA: Diagnosis not present

## 2018-02-03 MED ORDER — IBUPROFEN 600 MG PO TABS: 600.0000 mg | ORAL_TABLET | Freq: Four times a day (QID) | ORAL | 0 refills | Status: DC | PRN

## 2018-02-03 MED ORDER — IBUPROFEN 800 MG PO TABS
800.0000 mg | ORAL_TABLET | Freq: Once | ORAL | Status: AC
  Administered 2018-02-03: 800 mg via ORAL
  Filled 2018-02-03: qty 1
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
## 2018-02-03 NOTE — Discharge Instructions (Signed)
Your x-rays show that your bones look normal with no signs of fractures or dislocations.  You may have ongoing pain for the next couple of weeks, please read the instructions above.  Ibuprofen 3 times a day, ice, elevation

## 2018-02-03 NOTE — ED Notes (Signed)
Patient transported to X-ray 
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
## 2018-02-03 NOTE — ED Notes (Signed)
ED Provider at bedside. 

## 2018-02-05 DIAGNOSIS — M25561 Pain in right knee: Secondary | ICD-10-CM | POA: Diagnosis not present

## 2018-02-05 DIAGNOSIS — M545 Low back pain: Secondary | ICD-10-CM | POA: Diagnosis not present

## 2018-02-05 DIAGNOSIS — M542 Cervicalgia: Secondary | ICD-10-CM | POA: Diagnosis not present

## 2018-04-02 DIAGNOSIS — Z23 Encounter for immunization: Secondary | ICD-10-CM | POA: Diagnosis not present

## 2018-04-02 DIAGNOSIS — Z0000 Encounter for general adult medical examination without abnormal findings: Secondary | ICD-10-CM | POA: Diagnosis not present

## 2018-04-02 DIAGNOSIS — M549 Dorsalgia, unspecified: Secondary | ICD-10-CM | POA: Diagnosis not present

## 2018-04-02 DIAGNOSIS — M542 Cervicalgia: Secondary | ICD-10-CM | POA: Diagnosis not present

## 2018-04-02 DIAGNOSIS — E781 Pure hyperglyceridemia: Secondary | ICD-10-CM | POA: Diagnosis not present

## 2018-04-28 ENCOUNTER — Encounter (HOSPITAL_COMMUNITY): Payer: Self-pay

## 2018-04-28 ENCOUNTER — Emergency Department (HOSPITAL_COMMUNITY)
Admission: EM | Admit: 2018-04-28 | Discharge: 2018-04-28 | Disposition: A | Discharge status: Home / Self Care | Payer: 59 | Attending: Emergency Medicine | Admitting: Emergency Medicine

## 2018-04-28 DIAGNOSIS — Z79899 Other long term (current) drug therapy: Secondary | ICD-10-CM | POA: Insufficient documentation

## 2018-04-28 DIAGNOSIS — F17210 Nicotine dependence, cigarettes, uncomplicated: Secondary | ICD-10-CM | POA: Diagnosis not present

## 2018-04-28 DIAGNOSIS — L02416 Cutaneous abscess of left lower limb: Secondary | ICD-10-CM | POA: Insufficient documentation

## 2018-04-28 DIAGNOSIS — L0291 Cutaneous abscess, unspecified: Secondary | ICD-10-CM

## 2018-04-28 MED ORDER — DOXYCYCLINE HYCLATE 100 MG PO CAPS: 100.0000 mg | ORAL_CAPSULE | Freq: Two times a day (BID) | ORAL | 0 refills | Status: DC

## 2018-04-28 MED ORDER — LIDOCAINE-EPINEPHRINE 2 %-1:200000 IJ SOLN
20.0000 mL | Freq: Once | INTRAMUSCULAR | Status: AC
Start: 2018-04-28 — End: 2018-04-28
  Administered 2018-04-28: 20 mL via INTRADERMAL
  Filled 2018-04-28: qty 20

## 2018-04-28 NOTE — Discharge Instructions (Signed)
Please take antibiotic twice a day for the next 10 days.  Take until antibiotic is completely gone.  Return to the ER for wound recheck in 2 days.  Return sooner if you have a fever, noticed worsening redness or swelling.  Gently wash with warm soapy water daily and keep covered with gauze dressing.  It will ooze and leak for a couple of days, this is normal

## 2018-04-28 NOTE — ED Provider Notes (Signed)
[facility] Provider Note   CSN: [id number] Arrival date & time: 04/28/18  1128     History   Chief Complaint Chief Complaint  Patient presents with  . Abscess    HPI Randy Spencer is a 47 y.o. male.  HPI  Randy Spencer is a 47 year old male with no significant past medical history who presents to the emergency department for evaluation of left upper thigh bite.  Patient reports that he thinks that he may have been bit by an insect.  Has noticed over the last 2 or 3 days erythematous area of swelling over the left anterior upper thigh.  Pain is like pressure and worsened with rubbing the legs together or pressing over the area of swelling.  Has not had any drainage.  He is taking ibuprofen for pain with mild improvement.  He also reports laying still helps his symptoms.  He denies IV drug use, history of diabetes, HIV or immunocompromise.  Denies fevers, chills, abdominal pain, nausea/vomiting, testicular pain/swelling, genital sore, painful bowel movements.   Past Medical History:  Diagnosis Date  . Hemorrhoids   . Medical history non-contributory   . Smoker     There are no active problems to display for this patient.   Past Surgical History:  Procedure Laterality Date  . HEMORRHOID SURGERY N/A 04/07/2017   Procedure: HEMORRHOIDECTOMY;  Surgeon: Luretha MurphyMartin, Matthew, MD;  Location:  SURGERY CENTER;  Service: General;  Laterality: N/A;  . NO PAST SURGERIES          Home Medications    Prior to Admission medications   Medication Sig Start Date End Date Taking? Authorizing Provider  HYDROcodone-acetaminophen (NORCO) 5-325 MG tablet Take 1 tablet by mouth every 6 (six) hours as needed. Patient not taking: Reported on 02/03/2018 04/07/17   Luretha MurphyMartin, Matthew, MD  ibuprofen (ADVIL,MOTRIN) 600 MG tablet Take 1 tablet (600 mg total) by mouth every 6 (six) hours as needed. 02/03/18   Eber HongMiller, Brian, MD  Multiple Vitamin (MULTIVITAMIN  WITH MINERALS) TABS tablet Take 1 tablet by mouth daily.    [provider]    Family History No family history on file.  Social History Social History   Tobacco Use  . Smoking status: Current Every Day Smoker    Packs/day: 1.00    Types: Cigarettes  . Smokeless tobacco: Never Used  Substance Use Topics  . Alcohol use: Yes    Comment: social  . Drug use: No     Allergies   Patient has no known allergies.   Review of Systems Review of Systems  Constitutional: Negative for chills and fever.  Gastrointestinal: Negative for abdominal pain, nausea, rectal pain and vomiting.  Genitourinary: Negative for genital sores, penile swelling, scrotal swelling and testicular pain.  Musculoskeletal: Negative for gait problem.  Skin: Positive for color change (erythema over left upper thigh). Negative for wound.  Neurological: Negative for weakness and numbness.     Physical Exam Updated Vital Signs BP (!) 138/93 (BP Location: Right Arm)   Pulse 91   Temp 98.2 F (36.8 C) (Oral)   Resp 18   SpO2 99%   Physical Exam  Constitutional: He appears well-developed and well-nourished. No distress.  Sitting at bedside in no apparent distress, non-toxic appearing.  HENT:  Head: Normocephalic and atraumatic.  Eyes: Right eye exhibits no discharge. Left eye exhibits no discharge.  Pulmonary/Chest: Effort normal. No respiratory distress.  Genitourinary:  Genitourinary Comments: No testicular swelling or erythema. No penile  swelling or lesion.   Musculoskeletal:       Legs: Neurological: He is alert. Coordination normal.  Skin: Skin is warm and dry. He is not diaphoretic.  Psychiatric: He has a normal mood and affect. His behavior is normal.  Nursing note and vitals reviewed.    ED Treatments / Results  Labs (all labs ordered are listed, but only abnormal results are displayed) Labs Reviewed - No data to display  EKG None  Radiology No results  found.  Procedures .Marland KitchenIncision and Drainage Date/Time: 04/28/2018 3:25 PM Performed by: Kellie Shropshire, PA-C Authorized by: Kellie Shropshire, PA-C   Consent:    Consent obtained:  Verbal and emergent situation   Consent given by:  Patient   Risks discussed:  Bleeding, incomplete drainage, infection and pain   Alternatives discussed:  No treatment and delayed treatment Location:    Type:  Abscess   Size:  2   Location:  Lower extremity   Lower extremity location:  Leg   Leg location:  L upper leg Pre-procedure details:    Skin preparation:  Betadine Anesthesia (see MAR for exact dosages):    Anesthesia method:  Local infiltration   Local anesthetic:  Lidocaine 2% WITH epi Procedure type:    Complexity:  Simple Procedure details:    Incision types:  Stab incision   Scalpel blade:  11   Wound management:  Probed and deloculated   Drainage:  Purulent and bloody   Drainage amount:  Moderate   Wound treatment:  Wound left open   Packing materials:  None Post-procedure details:    Patient tolerance of procedure:  Tolerated well, no immediate complications   (including critical care time) EMERGENCY DEPARTMENT US SOFT TISSUE INTERPRETATION "Study: Limited Soft Tissue Ultrasound"  INDICATIONS: Pain and Soft tissue infection Multiple views of the body part were obtained in real-time with a multi-frequency linear probe  PERFORMED BY: Myself IMAGES ARCHIVED?: Yes SIDE:Left BODY PART:Lower extremity INTERPRETATION:  Abcess present and Cellulitis present  Medications Ordered in ED Medications  lidocaine-EPINEPHrine (XYLOCAINE W/EPI) 2 %-1:200000 (PF) injection 20 mL (20 mLs Intradermal Given 04/28/18 1306)     Initial Impression / Assessment and Plan / ED Course  I have reviewed the triage vital signs and the nursing notes.  Pertinent labs & imaging results that were available during my care of the patient were reviewed by me and considered in my medical decision making  (see chart for details).   Patient with skin abscess amenable to incision and drainage.  Abscess was not large enough to warrant packing or drain,  wound recheck in 2 days. Encouraged home warm soaks and flushing.  Moderate signs of cellulitis is surrounding skin.  Will d/c home with antibiotic.  Counseled him on return precautions and he agrees and appears reliable.   Final Clinical Impressions(s) / ED Diagnoses   Final diagnoses:  Abscess    ED Discharge Orders        Ordered    doxycycline (VIBRAMYCIN) 100 MG capsule  2 times daily     04/28/18 1527       Lawrence Marseilles 04/28/18 1529    Jacalyn Lefevre, MD 04/28/18 [id number]

## 2018-04-28 NOTE — ED Triage Notes (Signed)
Pt presents for evaluation of possible abscess to L groin/inner thigh x 2-3 days.

## 2018-04-30 ENCOUNTER — Emergency Department (HOSPITAL_COMMUNITY)
Admission: EM | Admit: 2018-04-30 | Discharge: 2018-04-30 | Disposition: A | Discharge status: Home / Self Care | Payer: 59 | Attending: Emergency Medicine | Admitting: Emergency Medicine

## 2018-04-30 ENCOUNTER — Other Ambulatory Visit: Payer: Self-pay

## 2018-04-30 ENCOUNTER — Encounter (HOSPITAL_COMMUNITY): Payer: Self-pay | Admitting: *Deleted

## 2018-04-30 DIAGNOSIS — Z4800 Encounter for change or removal of nonsurgical wound dressing: Secondary | ICD-10-CM | POA: Diagnosis not present

## 2018-04-30 DIAGNOSIS — Z5189 Encounter for other specified aftercare: Secondary | ICD-10-CM

## 2018-04-30 DIAGNOSIS — F17210 Nicotine dependence, cigarettes, uncomplicated: Secondary | ICD-10-CM | POA: Diagnosis not present

## 2018-04-30 DIAGNOSIS — L02416 Cutaneous abscess of left lower limb: Secondary | ICD-10-CM | POA: Diagnosis not present

## 2018-04-30 NOTE — ED Triage Notes (Signed)
Pt was seen on Saturday for same. States he had abscess drained to left thigh and was told to come back if still having pain. Denies fever. No acute distress is noted at triage.

## 2018-04-30 NOTE — ED Provider Notes (Signed)
[facility] Provider Note   CSN: [id number] Arrival date & time: 04/30/18  1754     History   Chief Complaint Chief Complaint  Patient presents with  . Abscess    HPI Lowella GripShannon Lee Risko is a 47 y.o. male.  Lowella GripShannon Lee Thwaites is a 47 y.o. Male with a history of smoking and hemorrhoids, presents to the ED for wound check.  Patient was seen here in the ED 2 days prior and had abscess drained on the left upper thigh.  Abscess was not packed.  He has been taking doxycycline as prescribed.  He reports it still little bit sore but pain is improving.  He feels that the redness has been receding and drainage has decreased.  He continues to use a dressing and a wrap over the area and has been doing warm soaks as directed.  Although it still little tender overall he does feel its improving.  No fevers or chills.     Past Medical History:  Diagnosis Date  . Hemorrhoids   . Medical history non-contributory   . Smoker     There are no active problems to display for this patient.   Past Surgical History:  Procedure Laterality Date  . HEMORRHOID SURGERY N/A 04/07/2017   Procedure: HEMORRHOIDECTOMY;  Surgeon: Luretha MurphyMartin, Matthew, MD;  Location: [facility];  Service: General;  Laterality: N/A;  . NO PAST SURGERIES          Home Medications    Prior to Admission medications   Medication Sig Start Date End Date Taking? Authorizing Provider  doxycycline (VIBRAMYCIN) 100 MG capsule Take 1 capsule (100 mg total) by mouth 2 (two) times daily. 04/28/18   Kellie ShropshireShrosbree, Emily J, PA-C  HYDROcodone-acetaminophen (NORCO) 5-325 MG tablet Take 1 tablet by mouth every 6 (six) hours as needed. Patient not taking: Reported on 02/03/2018 04/07/17   Luretha MurphyMartin, Matthew, MD  ibuprofen (ADVIL,MOTRIN) 600 MG tablet Take 1 tablet (600 mg total) by mouth every 6 (six) hours as needed. 02/03/18   Eber HongMiller, Brian, MD  Multiple Vitamin (MULTIVITAMIN WITH MINERALS) TABS tablet  Take 1 tablet by mouth daily.    [provider]    Family History History reviewed. No pertinent family history.  Social History Social History   Tobacco Use  . Smoking status: Current Every Day Smoker    Packs/day: 1.00    Types: Cigarettes  . Smokeless tobacco: Never Used  Substance Use Topics  . Alcohol use: Yes    Comment: social  . Drug use: No     Allergies   Patient has no known allergies.   Review of Systems Review of Systems  Constitutional: Negative for chills and fever.  Skin: Positive for color change and wound.     Physical Exam Updated Vital Signs BP 136/87 (BP Location: Right Arm)   Pulse 88   Temp 98.6 F (37 C) (Oral)   Resp 16   SpO2 100%   Physical Exam  Constitutional: He appears well-developed and well-nourished. No distress.  HENT:  Head: Normocephalic and atraumatic.  Eyes: Right eye exhibits no discharge. Left eye exhibits no discharge.  Pulmonary/Chest: Effort normal. No respiratory distress.  Neurological: He is alert. Coordination normal.  Skin: Skin is warm and dry. He is not diaphoretic.  1 cm incision present in the left inner thigh, small amount of expressible serous drainage, there is still mild surrounding induration, based on prior documentation this appears to be improving.  No lymphangitic  streaking.  No palpable fluctuance or evidence of reaccumulation of abscess.  Psychiatric: He has a normal mood and affect. His behavior is normal.  Nursing note and vitals reviewed.    ED Treatments / Results  Labs (all labs ordered are listed, but only abnormal results are displayed) Labs Reviewed - No data to display  EKG None  Radiology No results found.  Procedures Procedures (including critical care time)  Medications Ordered in ED Medications - No data to display   Initial Impression / Assessment and Plan / ED Course  I have reviewed the triage vital signs and the nursing notes.  Pertinent labs & imaging  results that were available during my care of the patient were reviewed by me and considered in my medical decision making (see chart for details).  Pt with abscess seen two days ago with I&D. Chart reviewed in EPIC. Pt has been doing a very good job of taking care of the site at home and has been taking medications as directed. The site is healthy appearing, with only a small amount of serous drainage around the skin but incision is open and without significant drainage.  Mild surrounding cellulitis which appears to be improving and no palpable fluctuance or induration indicating deeper infection or residual abscess. Pt with minimal pain or tenderness in the area. Discussed home care and return precautions, pt advised to continue all antibiotics until they are gone.   Final Clinical Impressions(s) / ED Diagnoses   Final diagnoses:  Abscess re-check    ED Discharge Orders    None       Legrand Rams 05/01/18 0119    Wynetta Fines, MD 05/02/18 [phone]

## 2018-04-30 NOTE — Discharge Instructions (Signed)
Continue with antibiotics and warm soaks, area appears to be healing well, and should continue to improve. Monitor for increasing redness, pain or drainage or any fevers, if these occurs return to the ED for reevaluation.

## 2018-07-18 ENCOUNTER — Encounter (HOSPITAL_COMMUNITY): Payer: Self-pay | Admitting: Emergency Medicine

## 2018-07-18 ENCOUNTER — Observation Stay (HOSPITAL_COMMUNITY)
Admission: EM | Admit: 2018-07-18 | Discharge: 2018-07-20 | Disposition: A | Discharge status: Home / Self Care | Payer: 59 | Attending: General Surgery | Admitting: General Surgery

## 2018-07-18 ENCOUNTER — Other Ambulatory Visit: Payer: Self-pay

## 2018-07-18 DIAGNOSIS — R112 Nausea with vomiting, unspecified: Secondary | ICD-10-CM | POA: Diagnosis not present

## 2018-07-18 DIAGNOSIS — F17210 Nicotine dependence, cigarettes, uncomplicated: Secondary | ICD-10-CM | POA: Insufficient documentation

## 2018-07-18 DIAGNOSIS — K3580 Unspecified acute appendicitis: Principal | ICD-10-CM | POA: Diagnosis present

## 2018-07-18 DIAGNOSIS — R1110 Vomiting, unspecified: Secondary | ICD-10-CM | POA: Diagnosis not present

## 2018-07-18 DIAGNOSIS — R109 Unspecified abdominal pain: Secondary | ICD-10-CM | POA: Diagnosis present

## 2018-07-18 DIAGNOSIS — K3589 Other acute appendicitis without perforation or gangrene: Secondary | ICD-10-CM

## 2018-07-18 HISTORY — DX: Personal history of urinary calculi: Z87.442

## 2018-07-18 LAB — URINALYSIS, ROUTINE W REFLEX MICROSCOPIC
Bilirubin Urine: NEGATIVE
Glucose, UA: NEGATIVE mg/dL
Hgb urine dipstick: NEGATIVE
Ketones, ur: 5 mg/dL — AB
Leukocytes, UA: NEGATIVE
Nitrite: NEGATIVE
Protein, ur: NEGATIVE mg/dL
Specific Gravity, Urine: 1.023 (ref 1.005–1.030)
pH: 7 (ref 5.0–8.0)

## 2018-07-18 LAB — COMPREHENSIVE METABOLIC PANEL WITH GFR
ALT: 43 U/L (ref 0–44)
AST: 31 U/L (ref 15–41)
Albumin: 3.7 g/dL (ref 3.5–5.0)
Alkaline Phosphatase: 53 U/L (ref 38–126)
Anion gap: 7 (ref 5–15)
BUN: 11 mg/dL (ref 6–20)
CO2: 24 mmol/L (ref 22–32)
Calcium: 9.4 mg/dL (ref 8.9–10.3)
Chloride: 107 mmol/L (ref 98–111)
Creatinine, Ser: 1.17 mg/dL (ref 0.61–1.24)
GFR calc Af Amer: >60 mL/min
GFR calc non Af Amer: >60 mL/min
Glucose, Bld: 119 mg/dL — ABNORMAL HIGH (ref 70–99)
Potassium: 4 mmol/L (ref 3.5–5.1)
Sodium: 138 mmol/L (ref 135–145)
Total Bilirubin: 0.6 mg/dL (ref 0.3–1.2)
Total Protein: 6.3 g/dL — ABNORMAL LOW (ref 6.5–8.1)

## 2018-07-18 LAB — CBC
HCT: 50.4 % (ref 39.0–52.0)
HEMOGLOBIN: 17 g/dL (ref 13.0–17.0)
MCH: 30.1 pg (ref 26.0–34.0)
MCHC: 33.7 g/dL (ref 30.0–36.0)
MCV: 89.2 fL (ref 80.0–100.0)
Platelets: 288 10*3/uL (ref 150–400)
RBC: 5.65 MIL/uL (ref 4.22–5.81)
RDW: 12.6 % (ref 11.5–15.5)
WBC: 14.4 10*3/uL — ABNORMAL HIGH (ref 4.0–10.5)
nRBC: 0 % (ref 0.0–0.2)

## 2018-07-18 LAB — LIPASE, BLOOD: Lipase: 33 U/L (ref 11–51)

## 2018-07-18 MED ORDER — ONDANSETRON HCL 4 MG/2ML IJ SOLN
4.0000 mg | Freq: Once | INTRAMUSCULAR | Status: AC
  Administered 2018-07-18: 4 mg via INTRAVENOUS
  Filled 2018-07-18: qty 2

## 2018-07-18 MED ORDER — MORPHINE SULFATE (PF) 4 MG/ML IV SOLN
4.0000 mg | Freq: Once | INTRAVENOUS | Status: AC
  Administered 2018-07-18: 4 mg via INTRAVENOUS
  Filled 2018-07-18: qty 1

## 2018-07-18 MED ORDER — ONDANSETRON 4 MG PO TBDP
4.0000 mg | ORAL_TABLET | Freq: Once | ORAL | Status: AC
  Administered 2018-07-18: 4 mg via ORAL
  Filled 2018-07-18: qty 1

## 2018-07-18 MED ORDER — SODIUM CHLORIDE 0.9 % IV SOLN
INTRAVENOUS | Status: DC
  Administered 2018-07-18: via INTRAVENOUS

## 2018-07-18 NOTE — ED Triage Notes (Signed)
Pt reports lower abd pain that started this morning. Pt took a pepto with no relief. Also N/V, no blood in vomit. Denies diarrhea.

## 2018-07-18 NOTE — ED Notes (Signed)
Pt became dizzy and lightheaded during blood draw. Pt repositioned.

## 2018-07-19 ENCOUNTER — Other Ambulatory Visit: Payer: Self-pay

## 2018-07-19 ENCOUNTER — Encounter (HOSPITAL_COMMUNITY): Payer: Self-pay | Admitting: Certified Registered Nurse Anesthetist

## 2018-07-19 ENCOUNTER — Observation Stay (HOSPITAL_COMMUNITY): Payer: 59 | Admitting: Certified Registered Nurse Anesthetist

## 2018-07-19 ENCOUNTER — Emergency Department (HOSPITAL_COMMUNITY): Payer: 59

## 2018-07-19 ENCOUNTER — Encounter (HOSPITAL_COMMUNITY)
Admission: EM | Disposition: A | Discharge status: Home / Self Care | Payer: Self-pay | Source: Home / Self Care | Attending: Emergency Medicine

## 2018-07-19 DIAGNOSIS — K3580 Unspecified acute appendicitis: Secondary | ICD-10-CM | POA: Diagnosis present

## 2018-07-19 DIAGNOSIS — F17210 Nicotine dependence, cigarettes, uncomplicated: Secondary | ICD-10-CM | POA: Diagnosis not present

## 2018-07-19 DIAGNOSIS — R1110 Vomiting, unspecified: Secondary | ICD-10-CM | POA: Diagnosis not present

## 2018-07-19 DIAGNOSIS — R109 Unspecified abdominal pain: Secondary | ICD-10-CM | POA: Diagnosis not present

## 2018-07-19 HISTORY — PX: LAPAROSCOPIC APPENDECTOMY: SHX408

## 2018-07-19 HISTORY — PX: APPENDECTOMY: SHX54

## 2018-07-19 LAB — SURGICAL PCR SCREEN
MRSA, PCR: NEGATIVE
STAPHYLOCOCCUS AUREUS: NEGATIVE

## 2018-07-19 LAB — HIV ANTIBODY (ROUTINE TESTING W REFLEX): HIV Screen 4th Generation wRfx: NONREACTIVE

## 2018-07-19 SURGERY — APPENDECTOMY, LAPAROSCOPIC
Anesthesia: General | Site: Abdomen

## 2018-07-19 MED ORDER — FENTANYL CITRATE (PF) 100 MCG/2ML IJ SOLN
INTRAMUSCULAR | Status: AC
  Administered 2018-07-19: 50 ug via INTRAVENOUS
  Filled 2018-07-19: qty 2

## 2018-07-19 MED ORDER — KETOROLAC TROMETHAMINE 15 MG/ML IJ SOLN
15.0000 mg | Freq: Four times a day (QID) | INTRAMUSCULAR | Status: DC | PRN
  Filled 2018-07-19: qty 1

## 2018-07-19 MED ORDER — ONDANSETRON HCL 4 MG/2ML IJ SOLN
INTRAMUSCULAR | Status: DC | PRN
  Administered 2018-07-19: 4 mg via INTRAVENOUS

## 2018-07-19 MED ORDER — HYDRALAZINE HCL 20 MG/ML IJ SOLN: 10.0000 mg | INTRAMUSCULAR | Status: DC | PRN

## 2018-07-19 MED ORDER — LIDOCAINE 2% (20 MG/ML) 5 ML SYRINGE
INTRAMUSCULAR | Status: DC | PRN
  Administered 2018-07-19: 100 mg via INTRAVENOUS

## 2018-07-19 MED ORDER — BUPIVACAINE-EPINEPHRINE 0.25% -1:200000 IJ SOLN
INTRAMUSCULAR | Status: DC | PRN
  Administered 2018-07-19: 8 mL

## 2018-07-19 MED ORDER — ENOXAPARIN SODIUM 40 MG/0.4ML ~~LOC~~ SOLN: 40.0000 mg | SUBCUTANEOUS | Status: DC

## 2018-07-19 MED ORDER — ROCURONIUM BROMIDE 50 MG/5ML IV SOSY
PREFILLED_SYRINGE | INTRAVENOUS | Status: AC
  Filled 2018-07-19: qty 5

## 2018-07-19 MED ORDER — ROCURONIUM BROMIDE 10 MG/ML (PF) SYRINGE
PREFILLED_SYRINGE | INTRAVENOUS | Status: DC | PRN
  Administered 2018-07-19: 40 mg via INTRAVENOUS

## 2018-07-19 MED ORDER — ONDANSETRON HCL 4 MG/2ML IJ SOLN: 4.0000 mg | Freq: Once | INTRAMUSCULAR | Status: DC | PRN

## 2018-07-19 MED ORDER — OXYCODONE HCL 5 MG PO TABS: 5.0000 mg | ORAL_TABLET | Freq: Four times a day (QID) | ORAL | 0 refills | Status: DC | PRN

## 2018-07-19 MED ORDER — MIDAZOLAM HCL 2 MG/2ML IJ SOLN
INTRAMUSCULAR | Status: AC
  Filled 2018-07-19: qty 2

## 2018-07-19 MED ORDER — 0.9 % SODIUM CHLORIDE (POUR BTL) OPTIME
TOPICAL | Status: DC | PRN
  Administered 2018-07-19: 1000 mL

## 2018-07-19 MED ORDER — DEXAMETHASONE SODIUM PHOSPHATE 10 MG/ML IJ SOLN
INTRAMUSCULAR | Status: AC
  Filled 2018-07-19: qty 1

## 2018-07-19 MED ORDER — FENTANYL CITRATE (PF) 250 MCG/5ML IJ SOLN
INTRAMUSCULAR | Status: AC
  Filled 2018-07-19: qty 5

## 2018-07-19 MED ORDER — DEXAMETHASONE SODIUM PHOSPHATE 10 MG/ML IJ SOLN
INTRAMUSCULAR | Status: DC | PRN
  Administered 2018-07-19: 10 mg via INTRAVENOUS

## 2018-07-19 MED ORDER — BUPIVACAINE-EPINEPHRINE (PF) 0.25% -1:200000 IJ SOLN
INTRAMUSCULAR | Status: AC
  Filled 2018-07-19: qty 30

## 2018-07-19 MED ORDER — PNEUMOCOCCAL VAC POLYVALENT 25 MCG/0.5ML IJ INJ: 0.5000 mL | INJECTION | INTRAMUSCULAR | Status: DC

## 2018-07-19 MED ORDER — MORPHINE SULFATE (PF) 2 MG/ML IV SOLN: 2.0000 mg | INTRAVENOUS | Status: DC | PRN

## 2018-07-19 MED ORDER — FENTANYL CITRATE (PF) 100 MCG/2ML IJ SOLN
25.0000 ug | INTRAMUSCULAR | Status: DC | PRN
  Administered 2018-07-19 (×2): 50 ug via INTRAVENOUS

## 2018-07-19 MED ORDER — SUGAMMADEX SODIUM 200 MG/2ML IV SOLN
INTRAVENOUS | Status: DC | PRN
  Administered 2018-07-19: 200 mg via INTRAVENOUS

## 2018-07-19 MED ORDER — MEPERIDINE HCL 50 MG/ML IJ SOLN: 6.2500 mg | INTRAMUSCULAR | Status: DC | PRN

## 2018-07-19 MED ORDER — ALUM & MAG HYDROXIDE-SIMETH 200-200-20 MG/5ML PO SUSP
30.0000 mL | Freq: Four times a day (QID) | ORAL | Status: DC | PRN
  Administered 2018-07-19: 30 mL via ORAL
  Filled 2018-07-19: qty 30

## 2018-07-19 MED ORDER — IOHEXOL 300 MG/ML  SOLN
100.0000 mL | Freq: Once | INTRAMUSCULAR | Status: AC | PRN
  Administered 2018-07-19: 100 mL via INTRAVENOUS

## 2018-07-19 MED ORDER — LIDOCAINE 2% (20 MG/ML) 5 ML SYRINGE
INTRAMUSCULAR | Status: AC
  Filled 2018-07-19: qty 5

## 2018-07-19 MED ORDER — SODIUM CHLORIDE 0.9 % IR SOLN
Status: DC | PRN
  Administered 2018-07-19: 1000 mL

## 2018-07-19 MED ORDER — DEXTROSE-NACL 5-0.9 % IV SOLN: INTRAVENOUS | Status: DC

## 2018-07-19 MED ORDER — FENTANYL CITRATE (PF) 100 MCG/2ML IJ SOLN
INTRAMUSCULAR | Status: DC | PRN
  Administered 2018-07-19 (×2): 100 ug via INTRAVENOUS
  Administered 2018-07-19: 50 ug via INTRAVENOUS

## 2018-07-19 MED ORDER — ONDANSETRON 4 MG PO TBDP: 4.0000 mg | ORAL_TABLET | Freq: Four times a day (QID) | ORAL | Status: DC | PRN

## 2018-07-19 MED ORDER — ONDANSETRON HCL 4 MG/2ML IJ SOLN
INTRAMUSCULAR | Status: AC
  Filled 2018-07-19: qty 2

## 2018-07-19 MED ORDER — HYDROMORPHONE HCL 1 MG/ML IJ SOLN: 1.0000 mg | INTRAMUSCULAR | Status: DC | PRN

## 2018-07-19 MED ORDER — PROPOFOL 10 MG/ML IV BOLUS
INTRAVENOUS | Status: DC | PRN
  Administered 2018-07-19: 200 mg via INTRAVENOUS

## 2018-07-19 MED ORDER — SODIUM CHLORIDE 0.9 % IV SOLN
2.0000 g | INTRAVENOUS | Status: DC
  Administered 2018-07-19: 2 g via INTRAVENOUS
  Filled 2018-07-19: qty 20

## 2018-07-19 MED ORDER — LACTATED RINGERS IV SOLN
INTRAVENOUS | Status: DC | PRN
  Administered 2018-07-19: 10:00:00 via INTRAVENOUS

## 2018-07-19 MED ORDER — ACETAMINOPHEN 500 MG PO TABS
1000.0000 mg | ORAL_TABLET | Freq: Four times a day (QID) | ORAL | Status: DC
  Administered 2018-07-19 – 2018-07-20 (×4): 1000 mg via ORAL
  Filled 2018-07-19 (×4): qty 2

## 2018-07-19 MED ORDER — OXYCODONE HCL 5 MG PO TABS
5.0000 mg | ORAL_TABLET | ORAL | Status: DC | PRN
  Administered 2018-07-19 – 2018-07-20 (×3): 5 mg via ORAL
  Filled 2018-07-19 (×3): qty 1

## 2018-07-19 MED ORDER — ONDANSETRON HCL 4 MG/2ML IJ SOLN: 4.0000 mg | Freq: Four times a day (QID) | INTRAMUSCULAR | Status: DC | PRN

## 2018-07-19 MED ORDER — KETOROLAC TROMETHAMINE 30 MG/ML IJ SOLN: 30.0000 mg | Freq: Once | INTRAMUSCULAR | Status: DC | PRN

## 2018-07-19 MED ORDER — INFLUENZA VAC SPLIT QUAD 0.5 ML IM SUSY: 0.5000 mL | PREFILLED_SYRINGE | INTRAMUSCULAR | Status: DC

## 2018-07-19 MED ORDER — MIDAZOLAM HCL 5 MG/5ML IJ SOLN
INTRAMUSCULAR | Status: DC | PRN
  Administered 2018-07-19: 2 mg via INTRAVENOUS

## 2018-07-19 MED ORDER — METRONIDAZOLE IN NACL 5-0.79 MG/ML-% IV SOLN
500.0000 mg | Freq: Three times a day (TID) | INTRAVENOUS | Status: DC
  Administered 2018-07-19: 500 mg via INTRAVENOUS
  Filled 2018-07-19 (×2): qty 100

## 2018-07-19 MED ORDER — KETOROLAC TROMETHAMINE 30 MG/ML IJ SOLN
INTRAMUSCULAR | Status: DC | PRN
  Administered 2018-07-19: 30 mg via INTRAVENOUS

## 2018-07-19 MED ORDER — SIMETHICONE 80 MG PO CHEW: 40.0000 mg | CHEWABLE_TABLET | Freq: Four times a day (QID) | ORAL | Status: DC | PRN

## 2018-07-19 MED ORDER — DEXTROSE-NACL 5-0.9 % IV SOLN
INTRAVENOUS | Status: DC
  Administered 2018-07-19: 03:00:00 via INTRAVENOUS

## 2018-07-19 SURGICAL SUPPLY — 41 items
APPLIER CLIP ROT 10 11.4 M/L (STAPLE)
CANISTER SUCT 3000ML PPV (MISCELLANEOUS) ×2 IMPLANT
CHLORAPREP W/TINT 26ML (MISCELLANEOUS) ×2 IMPLANT
CLIP APPLIE ROT 10 11.4 M/L (STAPLE) IMPLANT
COVER SURGICAL LIGHT HANDLE (MISCELLANEOUS) ×2 IMPLANT
COVER WAND RF STERILE (DRAPES) IMPLANT
CUTTER FLEX LINEAR 45M (STAPLE) ×2 IMPLANT
DERMABOND ADVANCED (GAUZE/BANDAGES/DRESSINGS) ×1
DERMABOND ADVANCED .7 DNX12 (GAUZE/BANDAGES/DRESSINGS) ×1 IMPLANT
ELECT REM PT RETURN 9FT ADLT (ELECTROSURGICAL) ×2
ELECTRODE REM PT RTRN 9FT ADLT (ELECTROSURGICAL) ×1 IMPLANT
GLOVE BIO SURGEON STRL SZ7 (GLOVE) ×2 IMPLANT
GLOVE BIOGEL PI IND STRL 7.5 (GLOVE) ×1 IMPLANT
GLOVE BIOGEL PI INDICATOR 7.5 (GLOVE) ×1
GOWN STRL REUS W/ TWL LRG LVL3 (GOWN DISPOSABLE) ×3 IMPLANT
GOWN STRL REUS W/TWL LRG LVL3 (GOWN DISPOSABLE) ×3
GRASPER SUT TROCAR 14GX15 (MISCELLANEOUS) ×2 IMPLANT
KIT BASIN OR (CUSTOM PROCEDURE TRAY) ×2 IMPLANT
KIT TURNOVER KIT B (KITS) ×2 IMPLANT
NS IRRIG 1000ML POUR BTL (IV SOLUTION) ×2 IMPLANT
PAD ARMBOARD 7.5X6 YLW CONV (MISCELLANEOUS) ×4 IMPLANT
POUCH RETRIEVAL ECOSAC 10 (ENDOMECHANICALS) ×1 IMPLANT
POUCH RETRIEVAL ECOSAC 10MM (ENDOMECHANICALS) ×1
RELOAD 45 VASCULAR/THIN (ENDOMECHANICALS) ×2 IMPLANT
RELOAD STAPLE TA45 3.5 REG BLU (ENDOMECHANICALS) ×2 IMPLANT
SCISSORS LAP 5X35 DISP (ENDOMECHANICALS) IMPLANT
SET IRRIG TUBING LAPAROSCOPIC (IRRIGATION / IRRIGATOR) ×2 IMPLANT
SHEARS HARMONIC ACE PLUS 36CM (ENDOMECHANICALS) ×2 IMPLANT
SLEEVE ENDOPATH XCEL 5M (ENDOMECHANICALS) ×2 IMPLANT
SPECIMEN JAR SMALL (MISCELLANEOUS) ×2 IMPLANT
STRIP CLOSURE SKIN 1/2X4 (GAUZE/BANDAGES/DRESSINGS) ×2 IMPLANT
SUT MNCRL AB 4-0 PS2 18 (SUTURE) ×2 IMPLANT
SUT VICRYL 0 UR6 27IN ABS (SUTURE) ×2 IMPLANT
TOWEL OR 17X24 6PK STRL BLUE (TOWEL DISPOSABLE) ×2 IMPLANT
TOWEL OR 17X26 10 PK STRL BLUE (TOWEL DISPOSABLE) ×2 IMPLANT
TRAY FOLEY CATH SILVER 16FR (SET/KITS/TRAYS/PACK) ×2 IMPLANT
TRAY LAPAROSCOPIC MC (CUSTOM PROCEDURE TRAY) ×2 IMPLANT
TROCAR XCEL BLUNT TIP 100MML (ENDOMECHANICALS) ×2 IMPLANT
TROCAR XCEL NON-BLD 5MMX100MML (ENDOMECHANICALS) ×2 IMPLANT
TUBING INSUFFLATION (TUBING) ×2 IMPLANT
WATER STERILE IRR 1000ML POUR (IV SOLUTION) ×2 IMPLANT

## 2018-07-19 NOTE — Transfer of Care (Signed)
Immediate Anesthesia Transfer of Care Note  Patient: Randy Spencer  Procedure(s) Performed: LAPAROSCOPIC APPENDECTOMY (N/A Abdomen)  Patient Location: PACU  Anesthesia Type:General  Level of Consciousness: patient cooperative and responds to stimulation  Airway & Oxygen Therapy: Patient Spontanous Breathing and Patient connected to nasal cannula oxygen  Post-op Assessment: Report given to RN and Post -op Vital signs reviewed and stable  Post vital signs: Reviewed and stable  Last Vitals:  Vitals Value Taken Time  BP 136/94 07/19/2018 10:28 AM  Temp    Pulse 71 07/19/2018 10:30 AM  Resp 12 07/19/2018 10:30 AM  SpO2 98 % 07/19/2018 10:30 AM  Vitals shown include unvalidated device data.  Last Pain:  Vitals:   07/19/18 1028  TempSrc:   PainSc: (P) 0-No pain         Complications: No apparent anesthesia complications

## 2018-07-19 NOTE — Discharge Summary (Deleted)
[facility] Discharge Summary   Patient ID: Randy Spencer MRN: [id number] DOB/AGE: 45-01-1971 45 y.o.  Admit date: 07/18/2018 Discharge date: 07/19/2018  Admitting Diagnosis: Acute appendicitis  Discharge Diagnosis Patient Active Problem List   Diagnosis Date Noted  . Acute appendicitis 07/19/2018    Consultants None  Imaging: Ct Abdomen Pelvis W Contrast  Result Date: 07/19/2018 CLINICAL DATA:  Lower abdominal pain, nausea, and vomiting. EXAM: CT ABDOMEN AND PELVIS WITH CONTRAST TECHNIQUE: Multidetector CT imaging of the abdomen and pelvis was performed using the standard protocol following bolus administration of intravenous contrast. CONTRAST:  OMNIPAQUE IOHEXOL 300 MG/ML  SOLN COMPARISON:  06/07/2013 FINDINGS: Lower chest: Dependent atelectasis in the lung bases. Hepatobiliary: No focal liver abnormality is seen. No gallstones, gallbladder wall thickening, or biliary dilatation. Pancreas: Unremarkable. No pancreatic ductal dilatation or surrounding inflammatory changes. Spleen: Normal in size without focal abnormality. Adrenals/Urinary Tract: Adrenal glands are unremarkable. Kidneys are normal, without renal calculi, focal lesion, or hydronephrosis. Bladder is unremarkable. Stomach/Bowel: Stomach is within normal limits. No evidence of bowel wall thickening, distention, or inflammatory changes. The appendix is fluid-filled and mildly distended to 8 mm diameter. No periappendiceal stranding. This is equivocal for early acute appendicitis. No abscess. Vascular/Lymphatic: No significant vascular findings are present. No enlarged abdominal or pelvic lymph nodes. Reproductive: Prostate is unremarkable. Other: No abdominal wall hernia or abnormality. No abdominopelvic ascites. Musculoskeletal: No acute or significant osseous findings. IMPRESSION: 1. The appendix is fluid-filled and mildly distended to 8 mm diameter. This is equivocal for early acute appendicitis. No  periappendiceal stranding or abscess. 2. No evidence of bowel obstruction or inflammation. 3. Mild dependent atelectasis in the lung bases. Electronically Signed   By: Burman Nieves M.D.   On: 07/19/2018 00:50    Procedures Dr. Dwain Sarna (07/19/18) - Laparoscopic Appendectomy  Hospital Course:  Patient is a 47 year old male who presented to [facility] with abdominal pain.  Workup showed acute appendicitis.  Patient was admitted and underwent procedure listed above.  Tolerated procedure well and was transferred to the floor.  Diet was advanced as tolerated.  On POD#0, the patient was voiding well, tolerating diet, ambulating well, pain well controlled, vital signs stable, incisions c/d/i and felt stable for discharge home.  Patient will follow up in our office in 2 weeks and knows to call with questions or concerns.  He will call to confirm appointment date/time.    Physical Exam: General:  Alert, NAD, pleasant, comfortable Abd:  Soft, ND, mild tenderness, incisions C/D/I  Allergies as of 07/19/2018   No Known Allergies     Medication List    STOP taking these medications   HYDROcodone-acetaminophen 5-325 MG tablet Commonly known as:  NORCO/VICODIN     TAKE these medications   doxycycline 100 MG capsule Commonly known as:  VIBRAMYCIN Take 1 capsule (100 mg total) by mouth 2 (two) times daily.   ibuprofen 600 MG tablet Commonly known as:  ADVIL,MOTRIN Take 1 tablet (600 mg total) by mouth every 6 (six) hours as needed.   multivitamin with minerals Tabs tablet Take 1 tablet by mouth daily.   oxyCODONE 5 MG immediate release tablet Commonly known as:  Oxy IR/ROXICODONE Take 1 tablet (5 mg total) by mouth every 6 (six) hours as needed for moderate pain, severe pain or breakthrough pain.        Follow-up Information    [facility], [place]. Go on 08/02/2018.   Specialty:  General Surgery Why:  @ 3:45pm. Please  arrive 20 minutes prior to complete paperwork. please bring  photo ID and insurance card Contact information: [address] [place] [address] [phone]          Signed: Wells Guiles, [facility] 07/19/2018, 4:07 PM Pager: [phone] Consults: [phone] Mon-Fri 7:00 am-4:30 pm Sat-Sun 7:00 am-11:30 am

## 2018-07-19 NOTE — Progress Notes (Signed)
Patient ID: Randy Spencer, male   DOB: [birth year], 47 y.o.   MRN: [id number] rlq pain on exam, ct with likely early appy, wbc up, plan for lap appy today. Surgery, risks and recovery all discussed. I have reviewed all imaging and labs and examined patient

## 2018-07-19 NOTE — ED Notes (Signed)
Patient transported to CT 

## 2018-07-19 NOTE — Discharge Instructions (Signed)
[facility] LAPAROSCOPIC SURGERY: POST OP INSTRUCTIONS Use 400 mg ibuprofen every 8 hours for 72 hours and then as needed. Use ice several times daily  Always review your discharge instruction sheet given to you by the facility where your surgery was performed. IF YOU HAVE DISABILITY OR FAMILY LEAVE FORMS, YOU MUST BRING THEM TO THE OFFICE FOR PROCESSING.   DO NOT GIVE THEM TO YOUR DOCTOR.  1. A prescription for pain medication may be given to you upon discharge.  Take your pain medication as prescribed, if needed.  If narcotic pain medicine is not needed, then you may take acetaminophen (Tylenol), naprosyn (Alleve), or ibuprofen (Advil) as needed. 2. Take your usually prescribed medications unless otherwise directed. 3. If you need a refill on your pain medication, please contact your pharmacy.  They will contact our office to request authorization. Prescriptions will not be filled after 5pm or on week-ends. 4. You should follow a light diet the first few days after arrival home, such as soup and crackers, etc.  Be sure to include lots of fluids daily. 5. Most patients will experience some swelling and bruising in the area of the incisions.  Ice packs will help.  Swelling and bruising can take several days to resolve.  6. It is common to experience some constipation if taking pain medication after surgery.  Increasing fluid intake and taking a stool softener (such as Colace) will usually help or prevent this problem from occurring.  A mild laxative (Milk of Magnesia or Miralax) should be taken according to package instructions if there are no bowel movements after 48 hours. 7. Unless discharge instructions indicate otherwise, you may remove your bandages 48 hours after surgery, and you may shower at that time.  You may have steri-strips (small skin tapes) in place directly over the incision.  These strips should be left on the skin for 7-10 days.   If your surgeon used skin glue on the incision, you may shower in 24 hours.  The glue will flake off over the next 2-3 weeks.  Any sutures or staples will be removed at the office during your follow-up visit. 8. ACTIVITIES:  You may resume regular (light) daily activities beginning the next day--such as daily self-care, walking, climbing stairs--gradually increasing activities as tolerated.  You may have sexual intercourse when it is comfortable.  Refrain from any heavy lifting or straining until approved by your doctor. a. You may drive when you are no longer taking prescription pain medication, you can comfortably wear a seatbelt, and you can safely maneuver your car and apply brakes. b. RETURN TO WORK:  __________________________________________________________ 9. You should see your doctor in the office for a follow-up appointment approximately 2-3 weeks after your surgery.  Make sure that you call for this appointment within a day or two after you arrive home to insure a convenient appointment time. 10. OTHER INSTRUCTIONS: __________________________________________________________________________________________________________________________ __________________________________________________________________________________________________________________________ WHEN TO CALL YOUR DOCTOR: 1. Fever over 101.0 2. Inability to urinate 3. Continued bleeding from incision. 4. Increased pain, redness, or drainage from the incision. 5. Increasing abdominal pain  The clinic staff is available to answer your questions during regular business hours.  Please dont hesitate to call and ask to speak to one of the nurses for clinical concerns.  If you have a medical emergency, go to the nearest emergency room or call 911.  A surgeon from [facility] is always on call at the hospital. [address], [address], [place],  [place]  [address] ? [address] [address], [place], [place]   [address] [phone]  ? [phone] ? FAX [phone] Web site: [email or web address]

## 2018-07-19 NOTE — ED Provider Notes (Signed)
[facility] EMERGENCY DEPARTMENT Provider Note   CSN: [id number] Arrival date & time: 07/18/18  1944     History   Chief Complaint Chief Complaint  Patient presents with  . Abdominal Pain    HPI Randy Spencer is a 47 y.o. male.  The history is provided by the patient and medical records.  Abdominal Pain   Associated symptoms include nausea and vomiting.     47 year old male with history of hemorrhoids and kidney stones, presenting to the ED with left lower abdominal pain.  States that started this morning and has been progressively worsening.  Pain localized to his left lower abdomen, described as a cramp but also with some sharp, stabbing pains.  Has had some nausea and vomiting.  Denies any diarrhea.  No hematemesis.  He did try taking some Pepto-Bismol today without relief.  Did eat some oysters over the weekend, wife ate similar without any symptoms.  No prior abdominal surgeries aside from hemorrhoid surgery.  Past Medical History:  Diagnosis Date  . Hemorrhoids   . Medical history non-contributory   . Smoker     There are no active problems to display for this patient.   Past Surgical History:  Procedure Laterality Date  . HEMORRHOID SURGERY N/A 04/07/2017   Procedure: HEMORRHOIDECTOMY;  Surgeon: Luretha Murphy, MD;  Location: [facility];  Service: General;  Laterality: N/A;  . NO PAST SURGERIES          Home Medications    Prior to Admission medications   Medication Sig Start Date End Date Taking? Authorizing Provider  doxycycline (VIBRAMYCIN) 100 MG capsule Take 1 capsule (100 mg total) by mouth 2 (two) times daily. 04/28/18   Kellie Shropshire, PA-C  HYDROcodone-acetaminophen (NORCO) 5-325 MG tablet Take 1 tablet by mouth every 6 (six) hours as needed. Patient not taking: Reported on 02/03/2018 04/07/17   Luretha Murphy, MD  ibuprofen (ADVIL,MOTRIN) 600 MG tablet Take 1 tablet (600 mg total) by mouth every 6 (six) hours  as needed. 02/03/18   Eber Hong, MD  Multiple Vitamin (MULTIVITAMIN WITH MINERALS) TABS tablet Take 1 tablet by mouth daily.    [provider]    Family History No family history on file.  Social History Social History   Tobacco Use  . Smoking status: Current Every Day Smoker    Packs/day: 1.00    Types: Cigarettes  . Smokeless tobacco: Never Used  Substance Use Topics  . Alcohol use: Yes    Comment: social  . Drug use: No     Allergies   Patient has no known allergies.   Review of Systems Review of Systems  Gastrointestinal: Positive for abdominal pain, nausea and vomiting.  All other systems reviewed and are negative.    Physical Exam Updated Vital Signs BP (!) 142/96 (BP Location: Right Arm)   Pulse 61   Temp 97.8 F (36.6 C) (Oral)   Resp 20   Ht 5\' 10"  (1.778 m)   Wt 93 kg   SpO2 100%   BMI 29.41 kg/m   Physical Exam  Constitutional: He is oriented to person, place, and time. He appears well-developed and well-nourished.  HENT:  Head: Normocephalic and atraumatic.  Mouth/Throat: Oropharynx is clear and moist.  Eyes: Pupils are equal, round, and reactive to light. Conjunctivae and EOM are normal.  Neck: Normal range of motion.  Cardiovascular: Normal rate, regular rhythm and normal heart sounds.  Pulmonary/Chest: Effort normal and breath sounds normal.  Abdominal: Soft. Bowel sounds are normal. There is tenderness in the left lower quadrant. There is no rigidity and no guarding.  Musculoskeletal: Normal range of motion.  Neurological: He is alert and oriented to person, place, and time.  Skin: Skin is warm and dry.  Psychiatric: He has a normal mood and affect.  Nursing note and vitals reviewed.    ED Treatments / Results  Labs (all labs ordered are listed, but only abnormal results are displayed) Labs Reviewed  COMPREHENSIVE METABOLIC PANEL - Abnormal; Notable for the following components:      Result Value   Glucose, Bld 119 (*)     Total Protein 6.3 (*)    All other components within normal limits  CBC - Abnormal; Notable for the following components:   WBC 14.4 (*)    All other components within normal limits  URINALYSIS, ROUTINE W REFLEX MICROSCOPIC - Abnormal; Notable for the following components:   APPearance HAZY (*)    Ketones, ur 5 (*)    All other components within normal limits  LIPASE, BLOOD    EKG None  Radiology Ct Abdomen Pelvis W Contrast  Result Date: 07/19/2018 CLINICAL DATA:  Lower abdominal pain, nausea, and vomiting. EXAM: CT ABDOMEN AND PELVIS WITH CONTRAST TECHNIQUE: Multidetector CT imaging of the abdomen and pelvis was performed using the standard protocol following bolus administration of intravenous contrast. CONTRAST:  OMNIPAQUE IOHEXOL 300 MG/ML  SOLN COMPARISON:  06/07/2013 FINDINGS: Lower chest: Dependent atelectasis in the lung bases. Hepatobiliary: No focal liver abnormality is seen. No gallstones, gallbladder wall thickening, or biliary dilatation. Pancreas: Unremarkable. No pancreatic ductal dilatation or surrounding inflammatory changes. Spleen: Normal in size without focal abnormality. Adrenals/Urinary Tract: Adrenal glands are unremarkable. Kidneys are normal, without renal calculi, focal lesion, or hydronephrosis. Bladder is unremarkable. Stomach/Bowel: Stomach is within normal limits. No evidence of bowel wall thickening, distention, or inflammatory changes. The appendix is fluid-filled and mildly distended to 8 mm diameter. No periappendiceal stranding. This is equivocal for early acute appendicitis. No abscess. Vascular/Lymphatic: No significant vascular findings are present. No enlarged abdominal or pelvic lymph nodes. Reproductive: Prostate is unremarkable. Other: No abdominal wall hernia or abnormality. No abdominopelvic ascites. Musculoskeletal: No acute or significant osseous findings. IMPRESSION: 1. The appendix is fluid-filled and mildly distended to 8 mm diameter.  This is equivocal for early acute appendicitis. No periappendiceal stranding or abscess. 2. No evidence of bowel obstruction or inflammation. 3. Mild dependent atelectasis in the lung bases. Electronically Signed   By: Burman Nieves M.D.   On: 07/19/2018 00:50    Procedures Procedures (including critical care time)  Medications Ordered in ED Medications  ondansetron (ZOFRAN-ODT) disintegrating tablet 4 mg (4 mg Oral Given 07/18/18 2132)  morphine 4 MG/ML injection 4 mg (4 mg Intravenous Given 07/18/18 2359)  ondansetron (ZOFRAN) injection 4 mg (4 mg Intravenous Given 07/18/18 2359)  iohexol (OMNIPAQUE) 300 MG/ML solution 100 mL (100 mLs Intravenous Contrast Given 07/19/18 0011)     Initial Impression / Assessment and Plan / ED Course  I have reviewed the triage vital signs and the nursing notes.  Pertinent labs & imaging results that were available during my care of the patient were reviewed by me and considered in my medical decision making (see chart for details).  47 year old male here with left lower abdominal pain.  Began this morning has been worsening throughout the day.  One episode of nausea and nonbloody, nonbilious emesis.  He is afebrile and nontoxic.  Tenderness in the  left lower abdomen and somewhat in the suprapubic region.  Labs reviewed, does have leukocytosis but otherwise reassuring.  CT scan performed, findings of acute appendicitis.  Discussed with general surgery, Dr. Luisa Hart.  Will evaluate patient ED and admit for ongoing care.  He will order antibiotics.  Final Clinical Impressions(s) / ED Diagnoses   Final diagnoses:  Other acute appendicitis    ED Discharge Orders    None       Garlon Hatchet, PA-C 07/19/18 0205    Gilda Crease, MD 07/20/18 [phone]

## 2018-07-19 NOTE — Anesthesia Preprocedure Evaluation (Signed)
Anesthesia Evaluation  Patient identified by MRN, date of birth, ID band Patient awake    Reviewed: Allergy & Precautions, NPO status , Patient's Chart, lab work & pertinent test results  Airway Mallampati: I  TM Distance: >3 FB Neck ROM: Full    Dental no notable dental hx. (+) Teeth Intact, Dental Advisory Given   Pulmonary Current Smoker,    Pulmonary exam normal breath sounds clear to auscultation       Cardiovascular negative cardio ROS Normal cardiovascular exam Rhythm:Regular Rate:Normal     Neuro/Psych negative neurological ROS  negative psych ROS   GI/Hepatic negative GI ROS, Neg liver ROS,   Endo/Other  negative endocrine ROS  Renal/GU negative Renal ROS  negative genitourinary   Musculoskeletal negative musculoskeletal ROS (+)   Abdominal Normal abdominal exam  (+)   Peds negative pediatric ROS (+)  Hematology negative hematology ROS (+)   Anesthesia Other Findings   Reproductive/Obstetrics negative OB ROS                             Anesthesia Physical  Anesthesia Plan  ASA: II  Anesthesia Plan: General   Post-op Pain Management:    Induction: Intravenous  PONV Risk Score and Plan: 3 and Ondansetron, Dexamethasone and Midazolam  Airway Management Planned: Oral ETT  Additional Equipment:   Intra-op Plan:   Post-operative Plan: Extubation in OR  Informed Consent: I have reviewed the patients History and Physical, chart, labs and discussed the procedure including the risks, benefits and alternatives for the proposed anesthesia with the patient or authorized representative who has indicated his/her understanding and acceptance.   Dental advisory given  Plan Discussed with: CRNA and Surgeon  Anesthesia Plan Comments:         Anesthesia Quick Evaluation

## 2018-07-19 NOTE — H&P (Signed)
Randy Spencer is an 47 y.o. male.   Chief Complaint: Abdominal pain HPI: Asked to see patient at the request of Dr. Mariane Baumgarten of the emergency room for abdominal pain.  Patient relates a 1 day history of bilateral lower quadrant abdominal pain.  This started about 12 to 18 hours ago.  Is described as dull achy moderate in intensity without radiation.  Nothing makes it better or worse.  He has had no nausea or vomiting.  He denies diarrhea or blood in the stool.  CT shows a mildly dilated appendix.  He has a white count of 14,000.  Past Medical History:  Diagnosis Date  . Hemorrhoids   . Medical history non-contributory   . Smoker     Past Surgical History:  Procedure Laterality Date  . HEMORRHOID SURGERY N/A 04/07/2017   Procedure: HEMORRHOIDECTOMY;  Surgeon: Johnathan Hausen, MD;  Location: [place] [facility];  Service: General;  Laterality: N/A;  . NO PAST SURGERIES      No family history on file. Social History:  reports that he has been smoking cigarettes. He has been smoking about 1.00 pack per day. He has never used smokeless tobacco. He reports that he drinks alcohol. He reports that he does not use drugs.  Allergies: No Known Allergies   (Not in a hospital admission)  Results for orders placed or performed during the hospital encounter of 07/18/18 (from the past 48 hour(s))  Lipase, blood     Status: None   Collection Time: 07/18/18  9:02 PM  Result Value Ref Range   Lipase 33 11 - 51 U/L    Comment: Performed at [facility], [place] [address] [place], [place] [address]  Comprehensive metabolic panel     Status: Abnormal   Collection Time: 07/18/18  9:02 PM  Result Value Ref Range   Sodium 138 135 - 145 mmol/L   Potassium 4.0 3.5 - 5.1 mmol/L   Chloride 107 98 - 111 mmol/L   CO2 24 22 - 32 mmol/L   Glucose, Bld 119 (H) 70 - 99 mg/dL   BUN 11 6 - 20 mg/dL   Creatinine, Ser 1.17 0.61 - 1.24 mg/dL   Calcium 9.4 8.9 - 10.3 mg/dL   Total Protein 6.3 (L) 6.5 -  8.1 g/dL   Albumin 3.7 3.5 - 5.0 g/dL   AST 31 15 - 41 U/L   ALT 43 0 - 44 U/L   Alkaline Phosphatase 53 38 - 126 U/L   Total Bilirubin 0.6 0.3 - 1.2 mg/dL   GFR calc non Af Amer >60 >60 mL/min   GFR calc Af Amer >60 >60 mL/min    Comment: (NOTE) The eGFR has been calculated using the CKD EPI equation. This calculation has not been validated in all clinical situations. eGFR's persistently <60 mL/min signify possible Chronic Kidney Disease.    Anion gap 7 5 - 15    Comment: Performed at [facility] [address], [place], [place] [address]  CBC     Status: Abnormal   Collection Time: 07/18/18  9:02 PM  Result Value Ref Range   WBC 14.4 (H) 4.0 - 10.5 K/uL   RBC 5.65 4.22 - 5.81 MIL/uL   Hemoglobin 17.0 13.0 - 17.0 g/dL   HCT 50.4 39.0 - 52.0 %   MCV 89.2 80.0 - 100.0 fL   MCH 30.1 26.0 - 34.0 pg   MCHC 33.7 30.0 - 36.0 g/dL   RDW 12.6 11.5 - 15.5 %  Platelets 288 150 - 400 K/uL   nRBC 0.0 0.0 - 0.2 %    Comment: Performed at [facility], [place] [address] [place], [place] [address]  Urinalysis, Routine w reflex microscopic     Status: Abnormal   Collection Time: 07/18/18  9:12 PM  Result Value Ref Range   Color, Urine YELLOW YELLOW   APPearance HAZY (A) CLEAR   Specific Gravity, Urine 1.023 1.005 - 1.030   pH 7.0 5.0 - 8.0   Glucose, UA NEGATIVE NEGATIVE mg/dL   Hgb urine dipstick NEGATIVE NEGATIVE   Bilirubin Urine NEGATIVE NEGATIVE   Ketones, ur 5 (A) NEGATIVE mg/dL   Protein, ur NEGATIVE NEGATIVE mg/dL   Nitrite NEGATIVE NEGATIVE   Leukocytes, UA NEGATIVE NEGATIVE    Comment: Performed at [facility] [address], [place], [place] [address]   Ct Abdomen Pelvis W Contrast  Result Date: 07/19/2018 CLINICAL DATA:  Lower abdominal pain, nausea, and vomiting. EXAM: CT ABDOMEN AND PELVIS WITH CONTRAST TECHNIQUE: Multidetector CT imaging of the abdomen and pelvis was performed using the standard protocol following bolus administration of  intravenous contrast. CONTRAST:  151m OMNIPAQUE IOHEXOL 300 MG/ML  SOLN COMPARISON:  06/07/2013 FINDINGS: Lower chest: Dependent atelectasis in the lung bases. Hepatobiliary: No focal liver abnormality is seen. No gallstones, gallbladder wall thickening, or biliary dilatation. Pancreas: Unremarkable. No pancreatic ductal dilatation or surrounding inflammatory changes. Spleen: Normal in size without focal abnormality. Adrenals/Urinary Tract: Adrenal glands are unremarkable. Kidneys are normal, without renal calculi, focal lesion, or hydronephrosis. Bladder is unremarkable. Stomach/Bowel: Stomach is within normal limits. No evidence of bowel wall thickening, distention, or inflammatory changes. The appendix is fluid-filled and mildly distended to 8 mm diameter. No periappendiceal stranding. This is equivocal for early acute appendicitis. No abscess. Vascular/Lymphatic: No significant vascular findings are present. No enlarged abdominal or pelvic lymph nodes. Reproductive: Prostate is unremarkable. Other: No abdominal wall hernia or abnormality. No abdominopelvic ascites. Musculoskeletal: No acute or significant osseous findings. IMPRESSION: 1. The appendix is fluid-filled and mildly distended to 8 mm diameter. This is equivocal for early acute appendicitis. No periappendiceal stranding or abscess. 2. No evidence of bowel obstruction or inflammation. 3. Mild dependent atelectasis in the lung bases. Electronically Signed   By: WLucienne CapersM.D.   On: 07/19/2018 00:50    Review of Systems  Gastrointestinal: Positive for abdominal pain.  All other systems reviewed and are negative.   Blood pressure 118/65, pulse 76, temperature 97.8 F (36.6 C), temperature source Oral, resp. rate 20, height '5\' 10"'$  (1.778 m), weight 93 kg, SpO2 95 %. Physical Exam  Constitutional: He appears well-developed and well-nourished.  HENT:  Head: Normocephalic and atraumatic.  Eyes: Pupils are equal, round, and reactive to  light.  Neck: Normal range of motion.  Cardiovascular: Normal rate and regular rhythm.  Respiratory: Effort normal and breath sounds normal.  GI: There is tenderness. There is tenderness at McBurney's point.  Musculoskeletal: Normal range of motion.  Neurological: He is alert.  Skin: Skin is warm and dry.  Psychiatric: He has a normal mood and affect. His behavior is normal.     Assessment/Plan Early acute appendicitis without perforation  Admit for IV fluids, IV antibiotics and probable laparoscopic appendectomy later today.  The patient will remain n.p.o.  Questions were answered and the plan of care and pathophysiology of appendicitis discussed with patient and wife at bedside.  They voiced her understanding.  TTurner Daniels MD 07/19/2018, 1:27 AM

## 2018-07-19 NOTE — Op Note (Signed)
Preoperative diagnosis: Acute appendicitis Postoperative diagnosis: Same as above Procedure: Laparoscopic appendectomy Surgeon: Dr. Harden Mo Anesthesia: General Estimated blood loss: Minimal Complications: None Drains: None Specimens: Appendix to pathology Special count was correct at completion Decision to recovery stable condition  Indications: This a 47 year old male who presented last night with lower abdominal pain.  He has an elevated white blood cell count and a CT scan concerning for acute appendicitis.  He still remains tender in his right lower quadrant this morning.  I discussed with him going to the operating room for laparoscopic appendectomy.  Procedure: After informed consent was obtained the patient was taken to the operating room.  He was given antibiotics.  SCDs were in place.  He was placed under general anesthesia without complication.  He was prepped and draped in the standard sterile surgical fashion.  A surgical timeout was then performed.  I infiltrated Marcaine below the umbilicus.  I made a vertical incision.  I incised the fascia and into the peritoneum bluntly.  I then inserted a Hassan trocar after placing a 0 Vicryl pursestring suture.  The abdomen was insufflated 15 mmHg pressure.  I then inserted 2 further 5 mm trocars in the lower abdomen under direct vision.  The appendix was then noted to be acutely inflamed but not perforated.  I divided the appendiceal mesentery with the harmonic scalpel.  I then identified the base clearly.  I divided the appendix with a small cuff of the cecum with a GIA stapler.  This was placed in a retrieval bag and removed from the umbilicus.  Hemostasis was observed.  I then remove the [facility] trocar.  I tied my pursestring down.  I placed 2 additional 0 Vicryl sutures through the umbilical defect.  I then desufflated the abdomen and remove the remaining trocars.  These were closed with 4-0 Monocryl and glue.  He tolerated this well  was extubated and transferred to recovery stable.

## 2018-07-20 ENCOUNTER — Encounter (HOSPITAL_COMMUNITY): Payer: Self-pay | Admitting: General Surgery

## 2018-07-20 DIAGNOSIS — Z23 Encounter for immunization: Secondary | ICD-10-CM | POA: Diagnosis not present

## 2018-07-20 NOTE — Discharge Summary (Signed)
[facility] Surgery/Trauma Discharge Summary   Patient ID: Randy Spencer MRN: [id number] DOB/AGE: 10/23/1970 47 y.o.  Admit date: 07/18/2018 Discharge date: 07/20/2018  Admitting Diagnosis: appendicitis  Discharge Diagnosis Patient Active Problem List   Diagnosis Date Noted  . Acute appendicitis 07/19/2018    Consultants none  Imaging: Ct Abdomen Pelvis W Contrast  Result Date: 07/19/2018 CLINICAL DATA:  Lower abdominal pain, nausea, and vomiting. EXAM: CT ABDOMEN AND PELVIS WITH CONTRAST TECHNIQUE: Multidetector CT imaging of the abdomen and pelvis was performed using the standard protocol following bolus administration of intravenous contrast. CONTRAST:  OMNIPAQUE IOHEXOL 300 MG/ML  SOLN COMPARISON:  06/07/2013 FINDINGS: Lower chest: Dependent atelectasis in the lung bases. Hepatobiliary: No focal liver abnormality is seen. No gallstones, gallbladder wall thickening, or biliary dilatation. Pancreas: Unremarkable. No pancreatic ductal dilatation or surrounding inflammatory changes. Spleen: Normal in size without focal abnormality. Adrenals/Urinary Tract: Adrenal glands are unremarkable. Kidneys are normal, without renal calculi, focal lesion, or hydronephrosis. Bladder is unremarkable. Stomach/Bowel: Stomach is within normal limits. No evidence of bowel wall thickening, distention, or inflammatory changes. The appendix is fluid-filled and mildly distended to 8 mm diameter. No periappendiceal stranding. This is equivocal for early acute appendicitis. No abscess. Vascular/Lymphatic: No significant vascular findings are present. No enlarged abdominal or pelvic lymph nodes. Reproductive: Prostate is unremarkable. Other: No abdominal wall hernia or abnormality. No abdominopelvic ascites. Musculoskeletal: No acute or significant osseous findings. IMPRESSION: 1. The appendix is fluid-filled and mildly distended to 8 mm diameter. This is equivocal for early acute appendicitis. No  periappendiceal stranding or abscess. 2. No evidence of bowel obstruction or inflammation. 3. Mild dependent atelectasis in the lung bases. Electronically Signed   By: Burman Nieves M.D.   On: 07/19/2018 00:50    Procedures Dr. Dwain Sarna (07/19/18) - Laparoscopic Appendectomy  Hospital Course:  Pt is a 47 yo male who presented to [facility] with abdominal pain.  Workup showed appendicitis.  Patient was admitted and underwent procedure listed above.  Tolerated procedure well and was transferred to the floor.  Diet was advanced as tolerated.  On POD#1, the patient was voiding well, tolerating diet, ambulating well, pain well controlled, vital signs stable, incisions c/d/i and felt stable for discharge home.  Patient will follow up as outlined below and knows to call with questions or concerns.     Patient was discharged in good condition.  The [place] Substance controlled database was reviewed prior to prescribing narcotic pain medication to this patient.  Physical Exam: General:  Alert, NAD, pleasant, cooperative Cardio: RRR, S1 & S2 normal, no murmur, rubs, gallops Resp: Effort normal, lungs CTA bilaterally, no wheezes, rales, rhonchi Abd:  Soft, ND, normal bowel sounds, mild generalized TTP without guarding, incisions C/D/I  Skin: warm and dry, no rashes noted  Allergies as of 07/20/2018   No Known Allergies     Medication List    STOP taking these medications   HYDROcodone-acetaminophen 5-325 MG tablet Commonly known as:  NORCO/VICODIN     TAKE these medications   doxycycline 100 MG capsule Commonly known as:  VIBRAMYCIN Take 1 capsule (100 mg total) by mouth 2 (two) times daily.   ibuprofen 600 MG tablet Commonly known as:  ADVIL,MOTRIN Take 1 tablet (600 mg total) by mouth every 6 (six) hours as needed.   multivitamin with minerals Tabs tablet Take 1 tablet by mouth daily.   oxyCODONE 5 MG immediate release tablet Commonly known as:  Oxy IR/ROXICODONE Take 1  tablet (5 mg  total) by mouth every 6 (six) hours as needed for moderate pain, severe pain or breakthrough pain.        Follow-up Information    [facility], [place]. Go on 08/02/2018.   Specialty:  General Surgery Why:  @ 3:45pm. Please arrive 20 minutes prior to complete paperwork. please bring photo ID and insurance card Contact information: [address] [place] [address] [phone]          Signed: Joyce Copa [facility] 07/20/2018, 9:17 AM Pager: [phone] Consults: [phone] Mon-Fri 7:00 am-4:30 pm Sat-Sun 7:00 am-11:30 am

## 2018-07-20 NOTE — Plan of Care (Signed)
  Problem: Clinical Measurements: Goal: Ability to maintain clinical measurements within normal limits will improve Outcome: Progressing   Problem: Nutrition: Goal: Adequate nutrition will be maintained Outcome: Progressing   Problem: Elimination: Goal: Will not experience complications related to bowel motility Outcome: Progressing   

## 2018-07-23 NOTE — Anesthesia Postprocedure Evaluation (Signed)
Anesthesia Post Note  Patient: Randy Spencer  Procedure(s) Performed: LAPAROSCOPIC APPENDECTOMY (N/A Abdomen)     Patient location during evaluation: PACU Anesthesia Type: General Level of consciousness: awake Pain management: pain level controlled Vital Signs Assessment: post-procedure vital signs reviewed and stable Respiratory status: spontaneous breathing Cardiovascular status: stable Postop Assessment: no apparent nausea or vomiting Anesthetic complications: no    Last Vitals:  Vitals:   07/20/18 0606 07/20/18 1115  BP: 120/70 138/80  Pulse: 60 63  Resp: 20 17  Temp: 36.8 C 36.9 C  SpO2: 95% 98%    Last Pain:  Vitals:   07/20/18 1131  TempSrc:   PainSc: 6    Pain Goal: Patients Stated Pain Goal: 4 (07/20/18 1131)               Geraldene Eisel JR,JOHN Susann Givens

## 2018-08-11 LAB — GLUCOSE, POCT (MANUAL RESULT ENTRY): POC Glucose: 172 mg/dl — AB (ref 70–99)

## 2018-09-28 ENCOUNTER — Emergency Department (HOSPITAL_COMMUNITY): Payer: 59

## 2018-09-28 ENCOUNTER — Encounter (HOSPITAL_COMMUNITY): Payer: Self-pay | Admitting: Emergency Medicine

## 2018-09-28 ENCOUNTER — Emergency Department (HOSPITAL_COMMUNITY)
Admission: EM | Admit: 2018-09-28 | Discharge: 2018-09-28 | Disposition: A | Discharge status: Home / Self Care | Payer: 59 | Attending: Emergency Medicine | Admitting: Emergency Medicine

## 2018-09-28 ENCOUNTER — Other Ambulatory Visit: Payer: Self-pay

## 2018-09-28 DIAGNOSIS — F17210 Nicotine dependence, cigarettes, uncomplicated: Secondary | ICD-10-CM | POA: Insufficient documentation

## 2018-09-28 DIAGNOSIS — J209 Acute bronchitis, unspecified: Secondary | ICD-10-CM | POA: Insufficient documentation

## 2018-09-28 DIAGNOSIS — R05 Cough: Secondary | ICD-10-CM | POA: Diagnosis not present

## 2018-09-28 MED ORDER — AZITHROMYCIN 250 MG PO TABS
500.0000 mg | ORAL_TABLET | Freq: Once | ORAL | Status: AC
  Administered 2018-09-28: 500 mg via ORAL
  Filled 2018-09-28: qty 2

## 2018-09-28 MED ORDER — AZITHROMYCIN 250 MG PO TABS: 250.0000 mg | ORAL_TABLET | Freq: Every day | ORAL | 0 refills | Status: AC

## 2018-09-28 MED ORDER — PREDNISONE 20 MG PO TABS: 40.0000 mg | ORAL_TABLET | Freq: Every day | ORAL | 0 refills | Status: DC

## 2018-09-28 MED ORDER — PREDNISONE 20 MG PO TABS
60.0000 mg | ORAL_TABLET | ORAL | Status: AC
  Administered 2018-09-28: 60 mg via ORAL
  Filled 2018-09-28: qty 3

## 2018-09-28 MED ORDER — ALBUTEROL SULFATE HFA 108 (90 BASE) MCG/ACT IN AERS: 1.0000 | INHALATION_SPRAY | Freq: Four times a day (QID) | RESPIRATORY_TRACT | 0 refills | Status: DC | PRN

## 2018-09-28 NOTE — ED Provider Notes (Signed)
[facility] Provider [facility]   CSN: [id number] Arrival date & time: 09/28/18  1529     History   Chief Complaint Chief Complaint  Patient presents with  . Cough    HPI Randy [facility] is a 47 y.o. male.  HPI Patient presents with concern of cough, fatigue. Patient is a smoker. He notes that over the past 2 weeks he has had worsening cough, in spite of using OTC medication. No clear fever, nausea, vomiting, chest pain, dyspnea, belly pain.  He is here with his wife who assists with the HPI.  Past Medical History:  Diagnosis Date  . Hemorrhoids   . History of kidney stones   . Medical history non-contributory   . Smoker     Patient Active Problem List   Diagnosis Date Noted  . Acute appendicitis 07/19/2018    Past Surgical History:  Procedure Laterality Date  . APPENDECTOMY  07/19/2018   laproscopic  . HEMORRHOID SURGERY N/A 04/07/2017   Procedure: HEMORRHOIDECTOMY;  Surgeon: Luretha MurphyMartin, Matthew, MD;  Location: [facility];  Service: General;  Laterality: N/A;  . LAPAROSCOPIC APPENDECTOMY N/A 07/19/2018   Procedure: LAPAROSCOPIC APPENDECTOMY;  Surgeon: Emelia LoronWakefield, Matthew, MD;  [facility];  Service: General;  Laterality: N/A;        Home Medications    Prior to Admission medications   Medication Sig Start Date End Date Taking? Authorizing Provider  doxycycline (VIBRAMYCIN) 100 MG capsule Take 1 capsule (100 mg total) by mouth 2 (two) times daily. Patient not taking: Reported on 07/19/2018 04/28/18   Kellie ShropshireShrosbree, Emily J, PA-C  ibuprofen (ADVIL,MOTRIN) 600 MG tablet Take 1 tablet (600 mg total) by mouth every 6 (six) hours as needed. Patient not taking: Reported on 07/19/2018 02/03/18   Eber HongMiller, Brian, MD  Multiple Vitamin (MULTIVITAMIN WITH MINERALS) TABS tablet Take 1 tablet by mouth daily.    [provider]  oxyCODONE (OXY IR/ROXICODONE) 5 MG immediate release tablet Take 1 tablet (5 mg total) by  mouth every 6 (six) hours as needed for moderate pain, severe pain or breakthrough pain. 07/19/18   Emelia LoronWakefield, Matthew, MD    Family History No family history on file.  Social History Social History   Tobacco Use  . Smoking status: Current Every Day Smoker    Packs/day: 1.00    Years: 30.00    Pack years: 30.00    Types: Cigarettes  . Smokeless tobacco: Never Used  Substance Use Topics  . Alcohol use: Yes    Comment: social  . Drug use: No     Allergies   Patient has no known allergies.   Review of Systems Review of Systems  Constitutional:       Per HPI, otherwise negative  HENT:       Per HPI, otherwise negative  Respiratory:       Per HPI, otherwise negative  Cardiovascular:       Per HPI, otherwise negative  Gastrointestinal: Negative for vomiting.  Endocrine:       Negative aside from HPI  Genitourinary:       Neg aside from HPI   Musculoskeletal:       Per HPI, otherwise negative  Skin: Negative.   Neurological: Negative for syncope.     Physical Exam Updated Vital Signs BP 133/81 (BP Location: Right Arm)   Pulse 84   Temp (!) 97 F (36.1 C) (Oral)   Resp 20   Ht 5' 10.5" (1.791 m)  Wt 93.4 kg   SpO2 99%   BMI 29.14 kg/m   Physical Exam Vitals signs and nursing note reviewed.  Constitutional:      General: He is not in acute distress.    Appearance: He is well-developed.  HENT:     Head: Normocephalic and atraumatic.  Eyes:     Conjunctiva/sclera: Conjunctivae normal.  Cardiovascular:     Rate and Rhythm: Normal rate and regular rhythm.  Pulmonary:     Effort: Pulmonary effort is normal. No respiratory distress.     Breath sounds: No stridor.     Comments: Diminished breath sounds throughout Abdominal:     General: There is no distension.  Skin:    General: Skin is warm and dry.  Neurological:     Mental Status: He is alert and oriented to person, place, and time.      ED Treatments / Results  Labs (all labs ordered are  listed, but only abnormal results are displayed) Labs Reviewed - No data to display  EKG None  Radiology Dg Chest 2 View  Result Date: 09/28/2018 CLINICAL DATA:  Productive cough EXAM: CHEST - 2 VIEW COMPARISON:  12/09/2015 FINDINGS: The heart size and mediastinal contours are within normal limits. Both lungs are clear. The visualized skeletal structures are unremarkable. IMPRESSION: No active cardiopulmonary disease. Electronically Signed   By: Jasmine PangKim  Fujinaga M.D.   On: 09/28/2018 18:51    Procedures Procedures (including critical care time)  Medications Ordered in ED Medications - No data to display   Initial Impression / Assessment and Plan / ED Course  I have reviewed the triage vital signs and the nursing notes.  Pertinent labs & imaging results that were available during my care of the patient were reviewed by me and considered in my medical decision making (see chart for details).     7:04 PM No distress, awake, alert. Discussed all findings with him and his wife. He has improved somewhat here, with concern for bronchitis given his smoking history, wheezing, patient will start a short course of steroids, bronchodilator.   Final Clinical Impressions(s) / ED Diagnoses  Acute bronchitis   Gerhard MunchLockwood, Ahman Dugdale, MD 09/28/18 1904

## 2018-09-28 NOTE — ED Triage Notes (Addendum)
Pt reports productive cough with green mucous and congestion for the last two weeks with some relief for otc medications. Pt denies pain. Concerned for pneumonia.

## 2018-09-28 NOTE — ED Notes (Signed)
Patient transported to X-ray 

## 2018-09-28 NOTE — ED Notes (Signed)
Pt stable and ambulatory for discharge, states understanding follow up.  

## 2018-09-28 NOTE — Discharge Instructions (Addendum)
As discussed, your evaluation today has been largely reassuring.  But, it is important that you monitor your condition carefully, and do not hesitate to return to the ED if you develop new, or concerning changes in your condition. ? ?Otherwise, please follow-up with your physician for appropriate ongoing care. ? ?

## 2018-10-20 DIAGNOSIS — Z23 Encounter for immunization: Secondary | ICD-10-CM | POA: Diagnosis not present

## 2018-12-16 ENCOUNTER — Emergency Department (HOSPITAL_COMMUNITY): Payer: 59

## 2018-12-16 ENCOUNTER — Emergency Department (HOSPITAL_COMMUNITY)
Admission: EM | Admit: 2018-12-16 | Discharge: 2018-12-16 | Disposition: A | Discharge status: Home / Self Care | Payer: 59 | Attending: Emergency Medicine | Admitting: Emergency Medicine

## 2018-12-16 ENCOUNTER — Other Ambulatory Visit: Payer: Self-pay

## 2018-12-16 ENCOUNTER — Encounter (HOSPITAL_COMMUNITY): Payer: Self-pay | Admitting: Emergency Medicine

## 2018-12-16 DIAGNOSIS — J111 Influenza due to unidentified influenza virus with other respiratory manifestations: Secondary | ICD-10-CM | POA: Diagnosis not present

## 2018-12-16 DIAGNOSIS — J101 Influenza due to other identified influenza virus with other respiratory manifestations: Secondary | ICD-10-CM | POA: Insufficient documentation

## 2018-12-16 DIAGNOSIS — F17210 Nicotine dependence, cigarettes, uncomplicated: Secondary | ICD-10-CM | POA: Diagnosis not present

## 2018-12-16 DIAGNOSIS — R509 Fever, unspecified: Secondary | ICD-10-CM | POA: Diagnosis present

## 2018-12-16 DIAGNOSIS — Z79899 Other long term (current) drug therapy: Secondary | ICD-10-CM | POA: Diagnosis not present

## 2018-12-16 DIAGNOSIS — R69 Illness, unspecified: Secondary | ICD-10-CM

## 2018-12-16 DIAGNOSIS — R05 Cough: Secondary | ICD-10-CM | POA: Diagnosis not present

## 2018-12-16 MED ORDER — FLUTICASONE PROPIONATE 50 MCG/ACT NA SUSP: 1.0000 | Freq: Every day | NASAL | 2 refills | Status: DC

## 2018-12-16 MED ORDER — GUAIFENESIN 100 MG/5ML PO SYRP: 100.0000 mg | ORAL_SOLUTION | ORAL | 0 refills | Status: DC | PRN

## 2018-12-16 NOTE — ED Triage Notes (Signed)
Pt reports fever and flu like symptoms starting Friday night. Pt afebrile since Saturday night. No complaints at this time.

## 2018-12-16 NOTE — ED Notes (Signed)
Back from xray

## 2018-12-16 NOTE — ED Provider Notes (Signed)
[facility] EMERGENCY DEPARTMENT Provider Note   CSN: [id number] Arrival date & time: 12/16/18  1526  History   Chief Complaint Chief Complaint  Patient presents with  . Fever   HPI Randy [facility] is a 48 y.o. male with history significant for tobacco abuse who presents for evaluation of flulike symptoms.  Patient states he had congestion, rhinorrhea, productive cough, body aches and pains x3 days.  States he had a fever at home, temp max 102 on Friday.  Fever resolved yesterday.  Has been afebrile x24 hours.  Has had productive cough with yellow/green sputum.  Received flu vaccine.  States she had similar symptoms approximately 1 week ago.  No recent travel or known contacts with COVID-19+  Patients.  Has been using Tylenol, warm liquids for symptoms.  States patient complained of overall weakness and severe body aches and pains on Friday, however states over the last 24 hours patient has "pepped up."  Has been able to tolerate p.o. intake without difficulty.  Denies nausea, vomiting, neck pain, neck stiffness, chest pain, shortness of breath, abdominal pain, diarrhea or dysuria.  Denies additional aggravating or alleviating factors.  Has not taken any medications for the last 24 hours PTA.  History obtained from patient and wife.  No interpreter was used.     HPI  Past Medical History:  Diagnosis Date  . Hemorrhoids   . History of kidney stones   . Medical history non-contributory   . Smoker     Patient Active Problem List   Diagnosis Date Noted  . Acute appendicitis 07/19/2018    Past Surgical History:  Procedure Laterality Date  . APPENDECTOMY  07/19/2018   laproscopic  . HEMORRHOID SURGERY N/A 04/07/2017   Procedure: HEMORRHOIDECTOMY;  Surgeon: Luretha Murphy, MD;  Location: [facility];  Service: General;  Laterality: N/A;  . LAPAROSCOPIC APPENDECTOMY N/A 07/19/2018   Procedure: LAPAROSCOPIC APPENDECTOMY;  Surgeon: Emelia Loron,  MD;  Location: [facility] OR;  Service: General;  Laterality: N/A;        Home Medications    Prior to Admission medications   Medication Sig Start Date End Date Taking? Authorizing Provider  albuterol (PROVENTIL HFA;VENTOLIN HFA) 108 (90 Base) MCG/ACT inhaler Inhale 1-2 puffs into the lungs every 6 (six) hours as needed for wheezing or shortness of breath. 09/28/18   Gerhard Munch, MD  doxycycline (VIBRAMYCIN) 100 MG capsule Take 1 capsule (100 mg total) by mouth 2 (two) times daily. Patient not taking: Reported on 07/19/2018 04/28/18   Kellie Shropshire, PA-C  fluticasone [facility]) 50 MCG/ACT nasal spray Place 1 spray into both nostrils daily. 12/16/18   Carianna Lague A, PA-C  guaifenesin (ROBITUSSIN) 100 MG/5ML syrup Take 5-10 mLs (100-200 mg total) by mouth every 4 (four) hours as needed for cough. 12/16/18   Ricca Melgarejo A, PA-C  ibuprofen (ADVIL,MOTRIN) 600 MG tablet Take 1 tablet (600 mg total) by mouth every 6 (six) hours as needed. Patient not taking: Reported on 07/19/2018 02/03/18   Eber Hong, MD  Multiple Vitamin (MULTIVITAMIN WITH MINERALS) TABS tablet Take 1 tablet by mouth daily.    [provider]  oxyCODONE (OXY IR/ROXICODONE) 5 MG immediate release tablet Take 1 tablet (5 mg total) by mouth every 6 (six) hours as needed for moderate pain, severe pain or breakthrough pain. 07/19/18   Emelia Loron, MD  predniSONE (DELTASONE) 20 MG tablet Take 2 tablets (40 mg total) by mouth daily with breakfast. For the next four days 09/28/18  Gerhard Munch, MD    Family History No family history on file.  Social History Social History   Tobacco Use  . Smoking status: Current Every Day Smoker    Packs/day: 1.00    Years: 30.00    Pack years: 30.00    Types: Cigarettes  . Smokeless tobacco: Never Used  Substance Use Topics  . Alcohol use: Yes    Comment: social  . Drug use: No     Allergies   Patient has no known allergies.   Review of Systems Review  of Systems  Constitutional: Positive for fever.  HENT: Positive for congestion, postnasal drip, rhinorrhea and sneezing. Negative for dental problem, drooling, ear discharge, ear pain, sinus pressure, sinus pain, sore throat, tinnitus, trouble swallowing and voice change.   Eyes: Negative.   Respiratory: Positive for cough. Negative for choking, chest tightness, shortness of breath, wheezing and stridor.   Cardiovascular: Negative.   Gastrointestinal: Negative.   Genitourinary: Negative.   Skin: Negative.   Neurological: Negative.   All other systems reviewed and are negative.    Physical Exam Updated Vital Signs BP 119/85 (BP Location: Right Arm)   Pulse 84   Temp 98 F (36.7 C) (Oral)   Resp 17   SpO2 98%   Physical Exam Vitals signs and nursing note reviewed.  Constitutional:      General: He is not in acute distress.    Appearance: He is well-developed. He is not ill-appearing, toxic-appearing or diaphoretic.     Comments: Sitting in bed texting on phone on initial evaluation.  No acute distress noted.  HENT:     Head: Normocephalic and atraumatic.     Right Ear: Tympanic membrane, ear canal and external ear normal. There is no impacted cerumen.     Left Ear: Tympanic membrane, ear canal and external ear normal. There is no impacted cerumen.     Nose:     Comments: Clear rhinorrhea and congestion to bilateral nares.    Mouth/Throat:     Comments: Posterior oropharynx erythematous without exudate.  Uvula midline without deviation.  No tonsillar edema or tonsillar exudate.  No evidence of PTA or RPA.  No drooling, dysphasia or trismus.  Phonation normal. Eyes:     Pupils: Pupils are equal, round, and reactive to light.  Neck:     Musculoskeletal: Normal range of motion and neck supple.     Comments: No neck stiffness or neck rigidity.  No meningismus.  No cervical lymphadenopathy. Cardiovascular:     Rate and Rhythm: Normal rate and regular rhythm.     Pulses: Normal  pulses.     Heart sounds: Normal heart sounds.  Pulmonary:     Effort: Pulmonary effort is normal. No respiratory distress.     Comments: Clear to auscultation bilateral without wheeze, rhonchi or rales.  No accessory muscle usage.  Able speak in full sentences without difficulty. Abdominal:     General: There is no distension.     Palpations: Abdomen is soft.     Comments: Soft, Nontender without rebound or guarding.  Normoactive bowel sounds.  Musculoskeletal: Normal range of motion.     Comments: Moves all 4 extremities without difficulty.  Skin:    General: Skin is warm and dry.     Comments: Brisk capillary refill.  No rashes or lesions.  Neurological:     Mental Status: He is alert.     Comments: Ambulates without difficulty.    ED Treatments / Results  Labs (  all labs ordered are listed, but only abnormal results are displayed) Labs Reviewed - No data to display  EKG None  Radiology Dg Chest 2 View  Result Date: 12/16/2018 CLINICAL DATA:  Cough, fever EXAM: CHEST - 2 VIEW COMPARISON:  09/28/2018 FINDINGS: The heart size and mediastinal contours are within normal limits. Both lungs are clear. The visualized skeletal structures are unremarkable. IMPRESSION: No acute abnormality of the lungs. Electronically Signed   By: Lauralyn Primes M.D.   On: 12/16/2018 17:06    Procedures Procedures (including critical care time)  Medications Ordered in ED Medications - No data to display   Initial Impression / Assessment and Plan / ED Course  I have reviewed the triage vital signs and the nursing notes.  Pertinent labs & imaging results that were available during my care of the patient were reviewed by me and considered in my medical decision making (see chart for details).  48 year old male appears otherwise well presents for evaluation of flulike symptoms.  Afebrile, nonseptic, non-ill-appearing.  Symptom onset 3 days PTA.  Lungs clear to auscultation bilateral without wheeze,  rhonchi or rales.  No tachycardia, tachypnea or hypoxia, low suspicion for pneumonia, chest x-ray negative for infiltrates, pulmonary edema, cardiomegaly, pneumothorax. Posterior oropharynx erythematous.  No tonsillar edema or tonsillar exudate.  No drooling, dysphasia or trismus.  Patient for pharyngitis.  Ears without otitis.  No neck stiffness or neck rigidity, no meningismus, low suspicion for meningitis.  Tolerating p.o. intake in department without difficulty.  No Urinary symptoms.  Patient with symptoms consistent with influenza.  Vitals are stable.  No signs of dehydration, tolerating PO's. The patient understands that symptoms are greater than the recommended 24-48 hour window of treatment.  Patient will be discharged with instructions to orally hydrate, rest, and use over-the-counter medications such as anti-inflammatories ibuprofen and Aleve for muscle aches and Tylenol for fever.  Patient will also be given a cough suppressant.  Patient hemodynamically stable and appropriate for DC home at this time.  I have discussed strict return precautions with patient and family.  Patient and family voiced understanding and are agreeable for follow-up.     Final Clinical Impressions(s) / ED Diagnoses   Final diagnoses:  Influenza-like illness    ED Discharge Orders         Ordered    guaifenesin (ROBITUSSIN) 100 MG/5ML syrup  Every 4 hours PRN     12/16/18 1723    fluticasone (FLONASE) 50 MCG/ACT nasal spray  Daily     12/16/18 1723           Laneah Luft A, PA-C 12/16/18 1726    Linwood Dibbles, MD 12/18/18 1643

## 2018-12-16 NOTE — Discharge Instructions (Addendum)
Your evaluated today for flulike symptoms.  Your chest x-ray was negative for pneumonia.  I would suggest rotating Tylenol and ibuprofen as needed for body aches and pains.  I have prescribed you a cough syrup as well as nasal spray.  Follow-up with PCP if you continue to have symptoms beyond 3 to 4 days.  Return to the ED for any worsening symptoms.

## 2019-04-30 ENCOUNTER — Emergency Department (HOSPITAL_COMMUNITY)
Admission: EM | Admit: 2019-04-30 | Discharge: 2019-04-30 | Disposition: A | Discharge status: Home / Self Care | Payer: 59 | Attending: Emergency Medicine | Admitting: Emergency Medicine

## 2019-04-30 ENCOUNTER — Encounter (HOSPITAL_COMMUNITY): Payer: Self-pay | Admitting: Emergency Medicine

## 2019-04-30 ENCOUNTER — Emergency Department (HOSPITAL_COMMUNITY): Payer: 59

## 2019-04-30 ENCOUNTER — Other Ambulatory Visit: Payer: Self-pay

## 2019-04-30 DIAGNOSIS — F17210 Nicotine dependence, cigarettes, uncomplicated: Secondary | ICD-10-CM | POA: Insufficient documentation

## 2019-04-30 DIAGNOSIS — Z79899 Other long term (current) drug therapy: Secondary | ICD-10-CM | POA: Insufficient documentation

## 2019-04-30 DIAGNOSIS — R51 Headache: Secondary | ICD-10-CM | POA: Insufficient documentation

## 2019-04-30 DIAGNOSIS — M542 Cervicalgia: Secondary | ICD-10-CM | POA: Diagnosis not present

## 2019-04-30 MED ORDER — METHOCARBAMOL 500 MG PO TABS: 500.0000 mg | ORAL_TABLET | Freq: Two times a day (BID) | ORAL | 0 refills | Status: AC

## 2019-04-30 MED ORDER — METHOCARBAMOL 500 MG PO TABS
500.0000 mg | ORAL_TABLET | Freq: Once | ORAL | Status: AC
  Administered 2019-04-30: 500 mg via ORAL
  Filled 2019-04-30: qty 1

## 2019-04-30 MED ORDER — ACETAMINOPHEN 500 MG PO TABS
1000.0000 mg | ORAL_TABLET | Freq: Once | ORAL | Status: AC
  Administered 2019-04-30: 1000 mg via ORAL
  Filled 2019-04-30: qty 2

## 2019-04-30 NOTE — ED Notes (Signed)
Patient verbalizes understanding of discharge instructions. Opportunity for questioning and answers were provided. Armband removed by staff, pt discharged from ED.  
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
## 2019-05-02 ENCOUNTER — Other Ambulatory Visit: Payer: Self-pay

## 2019-05-02 ENCOUNTER — Emergency Department (HOSPITAL_COMMUNITY)
Admission: EM | Admit: 2019-05-02 | Discharge: 2019-05-02 | Disposition: A | Discharge status: Home / Self Care | Payer: 59 | Attending: Emergency Medicine | Admitting: Emergency Medicine

## 2019-05-02 DIAGNOSIS — F17210 Nicotine dependence, cigarettes, uncomplicated: Secondary | ICD-10-CM | POA: Diagnosis not present

## 2019-05-02 DIAGNOSIS — Z0289 Encounter for other administrative examinations: Secondary | ICD-10-CM | POA: Diagnosis not present

## 2019-05-02 NOTE — ED Notes (Signed)
Pt reports having the opportunity to have questions answered and verbalizes understanding of discharge instructions
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
## 2020-06-11 ENCOUNTER — Encounter (HOSPITAL_COMMUNITY): Payer: Self-pay

## 2020-06-11 ENCOUNTER — Emergency Department (HOSPITAL_COMMUNITY): Payer: 59

## 2020-06-11 ENCOUNTER — Emergency Department (HOSPITAL_COMMUNITY)
Admission: EM | Admit: 2020-06-11 | Discharge: 2020-06-12 | Disposition: A | Discharge status: Home / Self Care | Payer: 59 | Attending: Emergency Medicine | Admitting: Emergency Medicine

## 2020-06-11 ENCOUNTER — Other Ambulatory Visit: Payer: Self-pay

## 2020-06-11 DIAGNOSIS — R05 Cough: Secondary | ICD-10-CM | POA: Diagnosis not present

## 2020-06-11 DIAGNOSIS — F17210 Nicotine dependence, cigarettes, uncomplicated: Secondary | ICD-10-CM | POA: Insufficient documentation

## 2020-06-11 DIAGNOSIS — R0602 Shortness of breath: Secondary | ICD-10-CM

## 2020-06-11 DIAGNOSIS — Z20822 Contact with and (suspected) exposure to covid-19: Secondary | ICD-10-CM | POA: Diagnosis not present

## 2020-06-11 DIAGNOSIS — R059 Cough, unspecified: Secondary | ICD-10-CM

## 2020-06-11 DIAGNOSIS — Z79899 Other long term (current) drug therapy: Secondary | ICD-10-CM | POA: Insufficient documentation

## 2020-06-11 DIAGNOSIS — R0981 Nasal congestion: Secondary | ICD-10-CM | POA: Insufficient documentation

## 2020-06-11 LAB — BASIC METABOLIC PANEL
Anion gap: 9 (ref 5–15)
BUN: 13 mg/dL (ref 6–20)
CO2: 22 mmol/L (ref 22–32)
Calcium: 8.8 mg/dL — ABNORMAL LOW (ref 8.9–10.3)
Chloride: 106 mmol/L (ref 98–111)
Creatinine, Ser: 1.11 mg/dL (ref 0.61–1.24)
GFR calc Af Amer: >60 mL/min (ref >60)
GFR calc non Af Amer: >60 mL/min (ref >60)
Glucose, Bld: 93 mg/dL (ref 70–99)
Potassium: 3.6 mmol/L (ref 3.5–5.1)
Sodium: 137 mmol/L (ref 135–145)

## 2020-06-11 LAB — CBC
HCT: 47.4 % (ref 39.0–52.0)
Hemoglobin: 15.4 g/dL (ref 13.0–17.0)
MCH: 29.4 pg (ref 26.0–34.0)
MCHC: 32.5 g/dL (ref 30.0–36.0)
MCV: 90.6 fL (ref 80.0–100.0)
Platelets: 276 10*3/uL (ref 150–400)
RBC: 5.23 MIL/uL (ref 4.22–5.81)
RDW: 13.2 % (ref 11.5–15.5)
WBC: 11.5 10*3/uL — ABNORMAL HIGH (ref 4.0–10.5)

## 2020-06-11 LAB — SARS CORONAVIRUS 2 BY RT PCR (HOSPITAL ORDER, PERFORMED IN ~~LOC~~ HOSPITAL LAB): SARS Coronavirus 2: NEGATIVE

## 2020-06-11 NOTE — ED Triage Notes (Signed)
Pt reports sob, cough and runny nose since yesterday, exposed to a friend who is positive. Pt has had both pfizer vaccines months ago. Resp e.u, nad noted.

## 2020-06-12 MED ORDER — ALBUTEROL SULFATE HFA 108 (90 BASE) MCG/ACT IN AERS
2.0000 | INHALATION_SPRAY | Freq: Once | RESPIRATORY_TRACT | Status: AC
  Administered 2020-06-12: 2 via RESPIRATORY_TRACT
  Filled 2020-06-12: qty 6.7

## 2020-06-12 NOTE — ED Provider Notes (Signed)
[facility] EMERGENCY DEPARTMENT Provider Note  CSN: [id number] Arrival date & time: 06/11/20 1805  Chief Complaint(s) Shortness of Breath, Cough, and Nasal Congestion  HPI Randy Spencer is a 49 y.o. male with a long time history of smoking who presents to the emergency department with 2 days of runny nose, nonproductive cough and shortness of breath.  Patient also reports generalized fatigue.  States that he was exposed to a friend who tested positive for Covid yesterday.  He denies any known fevers or chills.  No nausea or vomiting.  No chest pain.  No abdominal pain.  No diarrhea.  No other physical complaints.  Patient reports having had both of his Covid vaccines.  HPI  Past Medical History Past Medical History:  Diagnosis Date  . Hemorrhoids   . History of kidney stones   . Medical history non-contributory   . Smoker    Patient Active Problem List   Diagnosis Date Noted  . Acute appendicitis 07/19/2018   Home Medication(s) Prior to Admission medications   Medication Sig Start Date End Date Taking? Authorizing Provider  methocarbamol (ROBAXIN) 500 MG tablet Take 1 tablet (500 mg total) by mouth 2 (two) times daily. 04/30/19   Aviva Kluver B, PA-C  albuterol (PROVENTIL HFA;VENTOLIN HFA) 108 (90 Base) MCG/ACT inhaler Inhale 1-2 puffs into the lungs every 6 (six) hours as needed for wheezing or shortness of breath. Patient not taking: Reported on 04/30/2019 09/28/18 04/30/19  Gerhard Munch, MD  fluticasone [facility]) 50 MCG/ACT nasal spray Place 1 spray into both nostrils daily. Patient not taking: Reported on 04/30/2019 12/16/18 04/30/19  Henderly, Valli Glance, PA-C                                                                                                                                    Past Surgical History Past Surgical History:  Procedure Laterality Date  . APPENDECTOMY  07/19/2018   laproscopic  . HEMORRHOID SURGERY N/A 04/07/2017   Procedure:  HEMORRHOIDECTOMY;  Surgeon: Luretha Murphy, MD;  Location: [facility];  Service: General;  Laterality: N/A;  . LAPAROSCOPIC APPENDECTOMY N/A 07/19/2018   Procedure: LAPAROSCOPIC APPENDECTOMY;  Surgeon: Emelia Loron, MD;  Location: [facility] OR;  Service: General;  Laterality: N/A;   Family History No family history on file.  Social History Social History   Tobacco Use  . Smoking status: Current Every Day Smoker    Packs/day: 1.00    Years: 30.00    Pack years: 30.00    Types: Cigarettes  . Smokeless tobacco: Never Used  Vaping Use  . Vaping Use: Never used  Substance Use Topics  . Alcohol use: Yes    Comment: social  . Drug use: No   Allergies Patient has no known allergies.  Review of Systems Review of Systems All other systems are reviewed and are negative for acute change except as noted in the HPI  Physical  Exam Vital Signs  I have reviewed the triage vital signs BP (!) 133/93 (BP Location: Left Arm)   Pulse 66   Temp 98 F (36.7 C) (Oral)   Resp 18   Ht 5\' 10"  (1.778 m)   Wt 93.9 kg   SpO2 97%   BMI 29.70 kg/m   Physical Exam Vitals reviewed.  Constitutional:      General: He is not in acute distress.    Appearance: He is well-developed. He is not diaphoretic.  HENT:     Head: Normocephalic and atraumatic.     Nose: Nose normal.  Eyes:     General: No scleral icterus.       Right eye: No discharge.        Left eye: No discharge.     Conjunctiva/sclera: Conjunctivae normal.     Pupils: Pupils are equal, round, and reactive to light.  Cardiovascular:     Rate and Rhythm: Normal rate and regular rhythm.     Heart sounds: No murmur heard.  No friction rub. No gallop.   Pulmonary:     Effort: Pulmonary effort is normal. No respiratory distress.     Breath sounds: Normal breath sounds. No stridor. No wheezing or rales.  Abdominal:     General: There is no distension.     Palpations: Abdomen is soft.     Tenderness: There is no  abdominal tenderness.  Musculoskeletal:        General: No tenderness.     Cervical back: Normal range of motion and neck supple.  Skin:    General: Skin is warm and dry.     Findings: No erythema or rash.  Neurological:     Mental Status: He is alert and oriented to person, place, and time.     ED Results and Treatments Labs (all labs ordered are listed, but only abnormal results are displayed) Labs Reviewed  BASIC METABOLIC PANEL - Abnormal; Notable for the following components:      Result Value   Calcium 8.8 (*)    All other components within normal limits  CBC - Abnormal; Notable for the following components:   WBC 11.5 (*)    All other components within normal limits  SARS CORONAVIRUS 2 BY RT PCR (HOSPITAL ORDER, PERFORMED IN [facility])                                                                                                                         EKG  EKG Interpretation  Date/Time:    Ventricular Rate:    PR Interval:    QRS Duration:   QT Interval:    QTC Calculation:   R Axis:     Text Interpretation:        Radiology DG Chest Portable 1 View  Result Date: 06/11/2020 CLINICAL DATA:  Dyspnea EXAM: PORTABLE CHEST 1 VIEW COMPARISON:  12/16/2018 FINDINGS: The heart size and mediastinal contours are within normal limits. Both lungs  are clear. The visualized skeletal structures are unremarkable. IMPRESSION: No active disease. Electronically Signed   By: Helyn Numbers MD   On: 06/11/2020 23:03    Pertinent labs & imaging results that were available during my care of the patient were reviewed by me and considered in my medical decision making (see chart for details).  Medications Ordered in ED Medications  albuterol (VENTOLIN HFA) 108 (90 Base) MCG/ACT inhaler 2 puff (has no administration in time range)                                                                                                                                     Procedures Procedures  (including critical care time)  Medical Decision Making / ED Course I have reviewed the nursing notes for this encounter and the patient's prior records (if available in EHR or on provided paperwork).   Randy Spencer was evaluated in Emergency Department on 06/12/2020 for the symptoms described in the history of present illness. He was evaluated in the context of the global COVID-19 pandemic, which necessitated consideration that the patient might be at risk for infection with the SARS-CoV-2 virus that causes COVID-19. Institutional protocols and algorithms that pertain to the evaluation of patients at risk for COVID-19 are in a state of rapid change based on information released by regulatory bodies including the CDC and federal and state organizations. These policies and algorithms were followed during the patient's care in the ED.  Patient presents with viral symptoms for 2 days. adequate oral hydration. Rest of history as above.  Patient appears well. No signs of toxicity, patient is interactive. No hypoxia, tachypnea or other signs of respiratory distress. No sign of clinical dehydration. Lung exam clear. Rest of exam as above.  Labs ordered mild leukocytosis.  Rest of the labs grossly reassuring.  Chest x-ray clear without evidence of pneumonia. Covid test was negative.  Most consistent with early viral illness   No evidence suggestive of pharyngitis, AOM, PNA, or meningitis.  Discussed symptomatic treatment with the patient and they will follow closely with their PCP.        Final Clinical Impression(s) / ED Diagnoses Final diagnoses:  Nasal congestion  Cough  Shortness of breath   The patient appears reasonably screened and/or stabilized for discharge and I doubt any other medical condition or other [facility] requiring further screening, evaluation, or treatment in the ED at this time prior to discharge. Safe for discharge with strict return  precautions.  Disposition: Discharge  Condition: Good  I have discussed the results, Dx and Tx plan with the patient/family who expressed understanding and agree(s) with the plan. Discharge instructions discussed at length. The patient/family was given strict return precautions who verbalized understanding of the instructions. No further questions at time of discharge.    ED Discharge Orders    None       Follow Up: Georgann Housekeeper, MD [phone]  [address] [place] [address] [phone]  Schedule an appointment as soon as possible for a visit  As needed      This chart was dictated using voice recognition software.  Despite best efforts to proofread,  errors can occur which can change the documentation meaning.   Nira Conn, MD 06/12/20 [phone]
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
